# Patient Record
Sex: Male | Born: 1950 | Race: White | Hispanic: No | Smoking: Current every day smoker
Health system: Southern US, Community
[De-identification: ages and names within clinical notes are randomized; demographics above are authoritative.]

## PROBLEM LIST (undated history)

## (undated) DIAGNOSIS — I2510 Atherosclerotic heart disease of native coronary artery without angina pectoris: Secondary | ICD-10-CM

## (undated) DIAGNOSIS — I639 Cerebral infarction, unspecified: Secondary | ICD-10-CM

## (undated) DIAGNOSIS — I219 Acute myocardial infarction, unspecified: Secondary | ICD-10-CM

## (undated) DIAGNOSIS — F17200 Nicotine dependence, unspecified, uncomplicated: Secondary | ICD-10-CM

## (undated) DIAGNOSIS — Z955 Presence of coronary angioplasty implant and graft: Secondary | ICD-10-CM

## (undated) DIAGNOSIS — I509 Heart failure, unspecified: Secondary | ICD-10-CM

## (undated) DIAGNOSIS — R0600 Dyspnea, unspecified: Secondary | ICD-10-CM

## (undated) DIAGNOSIS — IMO0001 Reserved for inherently not codable concepts without codable children: Secondary | ICD-10-CM

## (undated) DIAGNOSIS — J449 Chronic obstructive pulmonary disease, unspecified: Secondary | ICD-10-CM

## (undated) HISTORY — DX: Cerebral infarction, unspecified: I63.9

## (undated) HISTORY — PX: APPENDECTOMY: SHX54

## (undated) HISTORY — DX: Heart failure, unspecified: I50.9

## (undated) HISTORY — DX: Reserved for inherently not codable concepts without codable children: IMO0001

## (undated) HISTORY — PX: OTHER SURGICAL HISTORY: SHX169

## (undated) HISTORY — DX: Nicotine dependence, unspecified, uncomplicated: F17.200

## (undated) HISTORY — DX: Acute myocardial infarction, unspecified: I21.9

## (undated) HISTORY — DX: Chronic obstructive pulmonary disease, unspecified: J44.9

---

## 2004-05-03 DIAGNOSIS — I219 Acute myocardial infarction, unspecified: Secondary | ICD-10-CM

## 2004-05-03 HISTORY — DX: Acute myocardial infarction, unspecified: I21.9

## 2006-02-06 ENCOUNTER — Emergency Department (HOSPITAL_COMMUNITY): Admission: EM | Admit: 2006-02-06 | Discharge: 2006-02-06 | Payer: Self-pay | Admitting: Family Medicine

## 2006-02-08 ENCOUNTER — Ambulatory Visit: Payer: Self-pay | Admitting: Nurse Practitioner

## 2006-02-10 ENCOUNTER — Ambulatory Visit: Payer: Self-pay | Admitting: Nurse Practitioner

## 2006-04-11 ENCOUNTER — Ambulatory Visit: Payer: Self-pay | Admitting: Nurse Practitioner

## 2006-05-09 ENCOUNTER — Ambulatory Visit: Payer: Self-pay | Admitting: Nurse Practitioner

## 2006-07-01 ENCOUNTER — Ambulatory Visit (HOSPITAL_COMMUNITY): Admission: RE | Admit: 2006-07-01 | Discharge: 2006-07-01 | Payer: Self-pay | Admitting: Cardiovascular Disease

## 2006-07-04 ENCOUNTER — Ambulatory Visit: Payer: Self-pay | Admitting: Nurse Practitioner

## 2006-08-15 ENCOUNTER — Ambulatory Visit: Payer: Self-pay | Admitting: Nurse Practitioner

## 2006-08-18 ENCOUNTER — Emergency Department (HOSPITAL_COMMUNITY): Admission: EM | Admit: 2006-08-18 | Discharge: 2006-08-18 | Payer: Self-pay | Admitting: Emergency Medicine

## 2006-10-17 ENCOUNTER — Ambulatory Visit: Payer: Self-pay | Admitting: Internal Medicine

## 2007-06-06 ENCOUNTER — Emergency Department (HOSPITAL_COMMUNITY): Admission: EM | Admit: 2007-06-06 | Discharge: 2007-06-06 | Payer: Self-pay | Admitting: Emergency Medicine

## 2007-06-12 ENCOUNTER — Ambulatory Visit: Payer: Self-pay | Admitting: Internal Medicine

## 2007-10-05 IMAGING — CT CT HEAD W/O CM
1 of 2 series · 16 of 30 positions shown, 20 images · IV contrast (agent unspecified)
Comparison: none

CLINICAL DATA: Fell off treadmill and hit head.
 HEAD CT WITHOUT CONTRAST:
TECHNIQUE: Contiguous axial images were obtained from the base of the skull through the vertex according to standard protocol without contrast.
 No comparison.

[Series 3: recon 2: trauma head · axial · 0.47mm/px · z∈[+133,+273]mm · 16 of 60 slices shown, 20 images]
[im 4/60  brain]
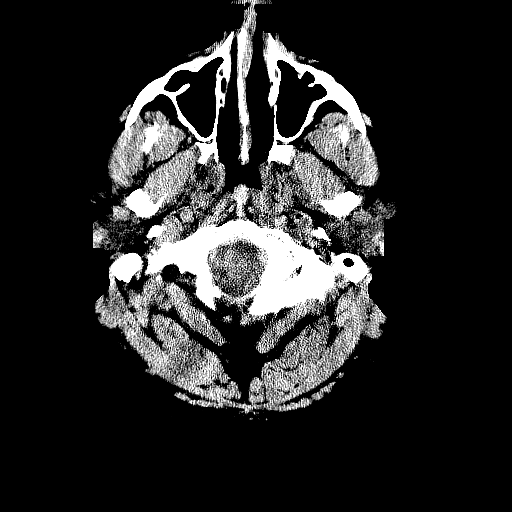
[im 4/60  bone]
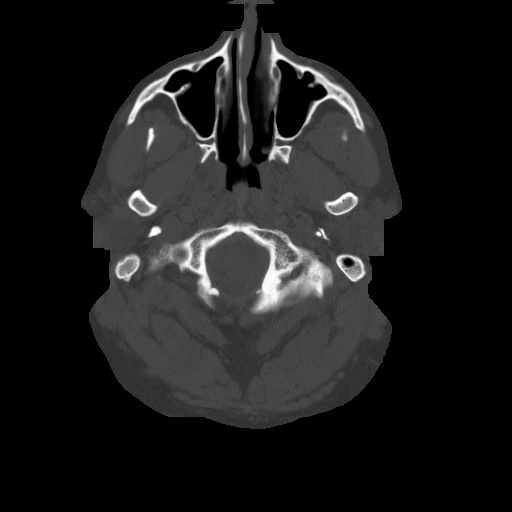
[im 7/60  brain]
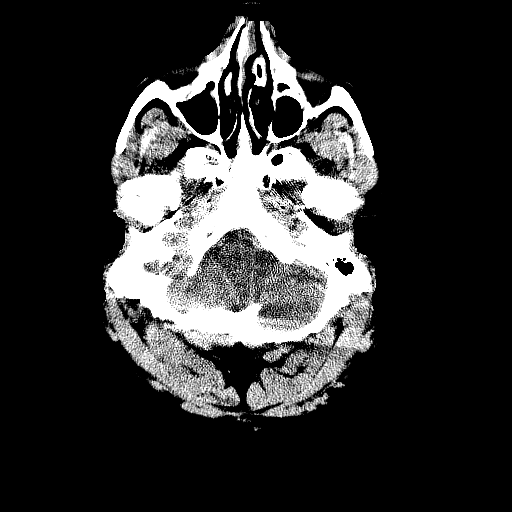
[im 10/60  brain]
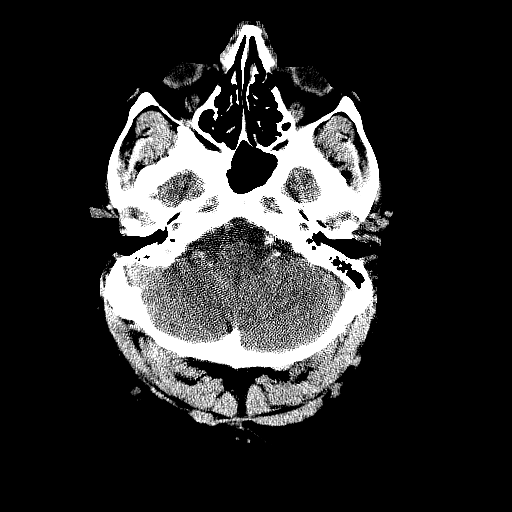
[im 13/60  brain]
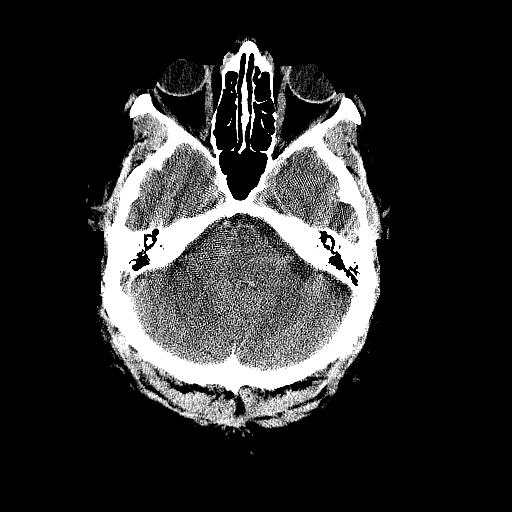
[im 19/60  brain]
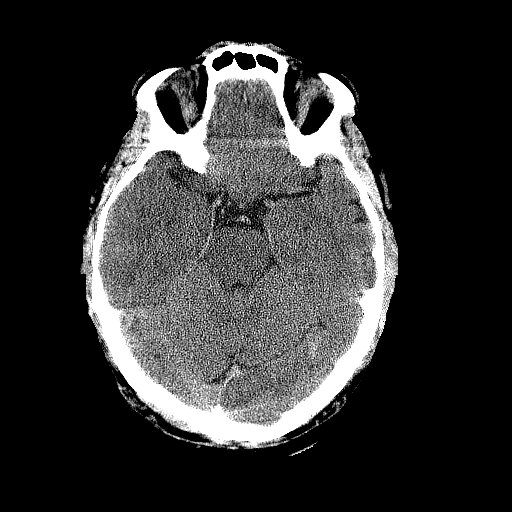
[im 19/60  bone]
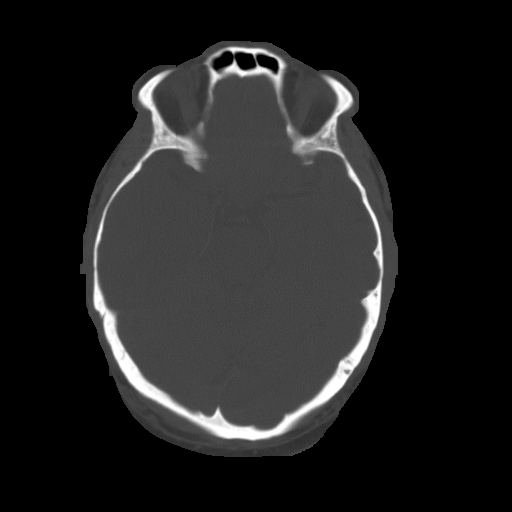
[im 22/60  brain]
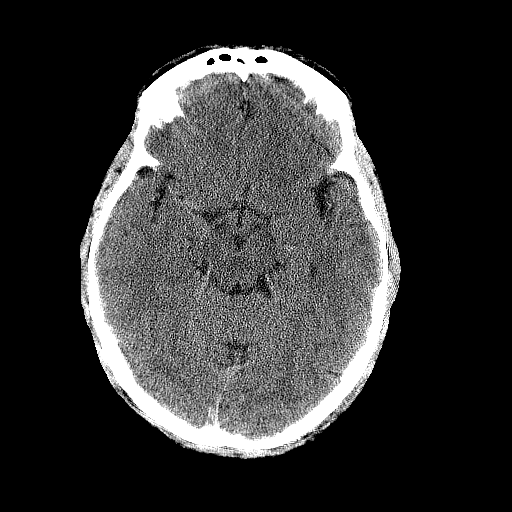
[im 25/60  brain]
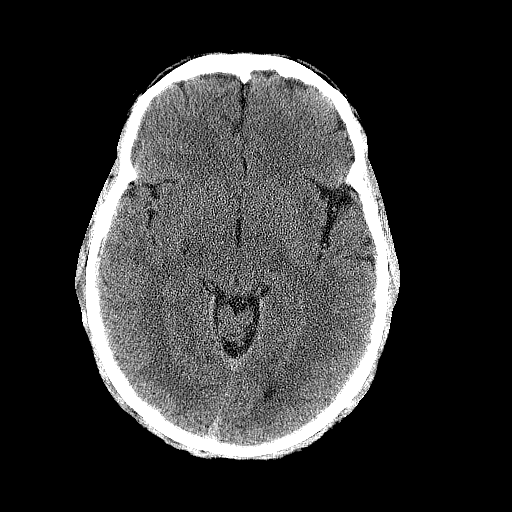
[im 28/60  brain]
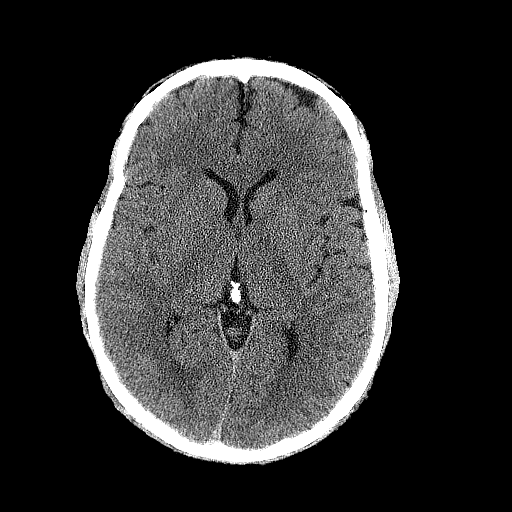
[im 32/60  brain]
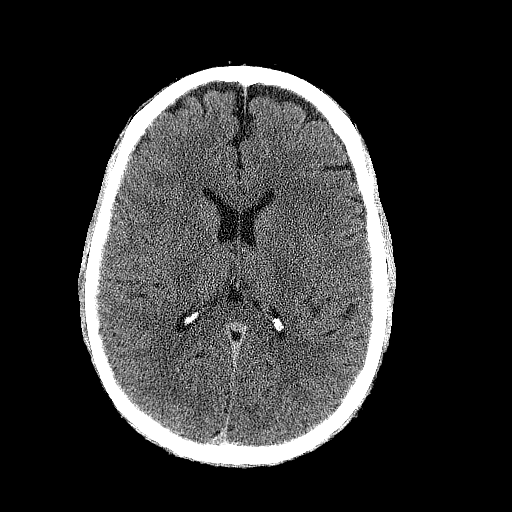
[im 32/60  bone]
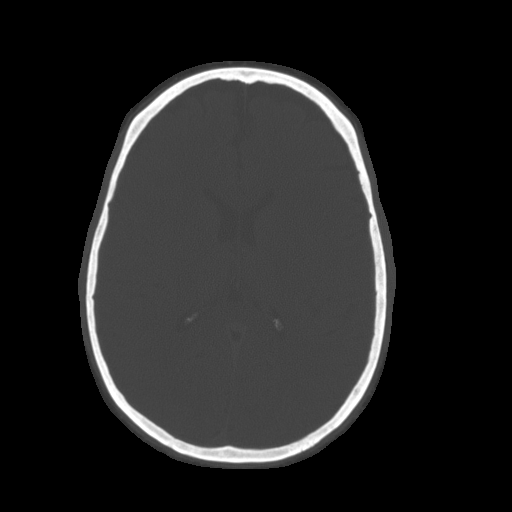
[im 35/60  brain]
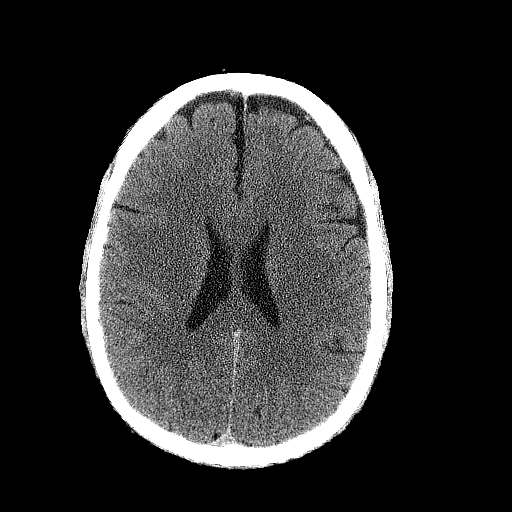
[im 38/60  brain]
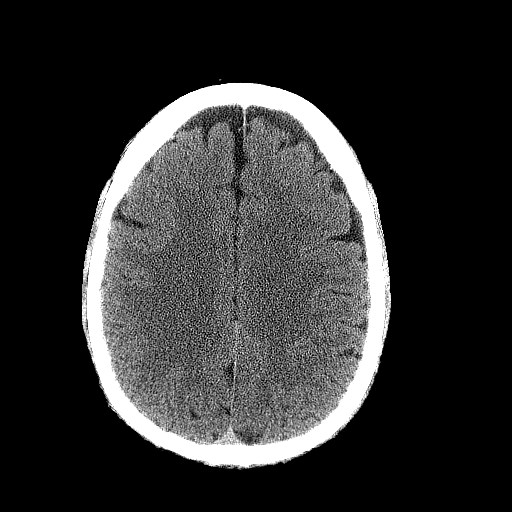
[im 41/60  brain]
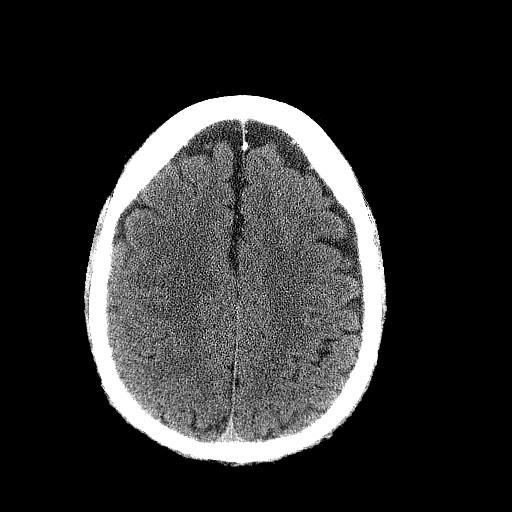
[im 47/60  brain]
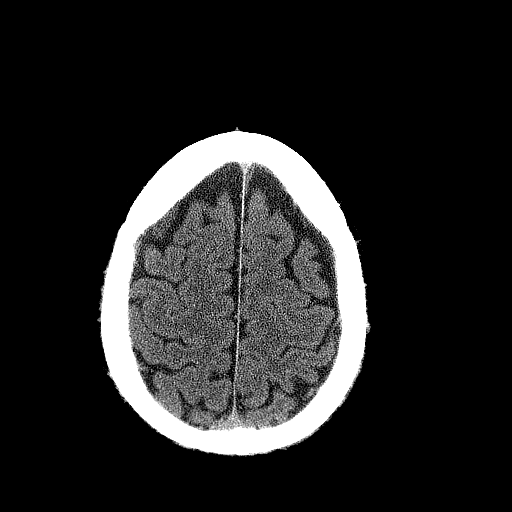
[im 47/60  bone]
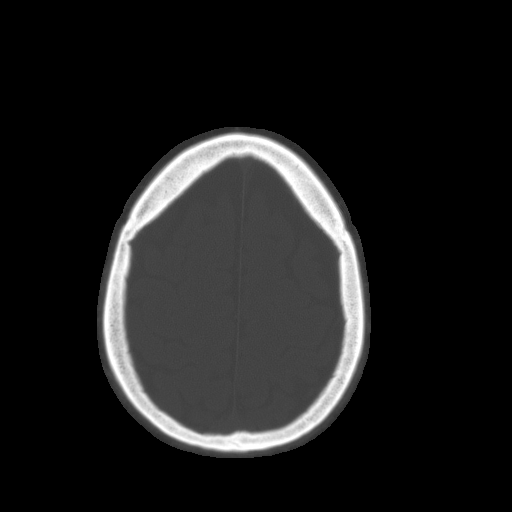
[im 50/60  brain]
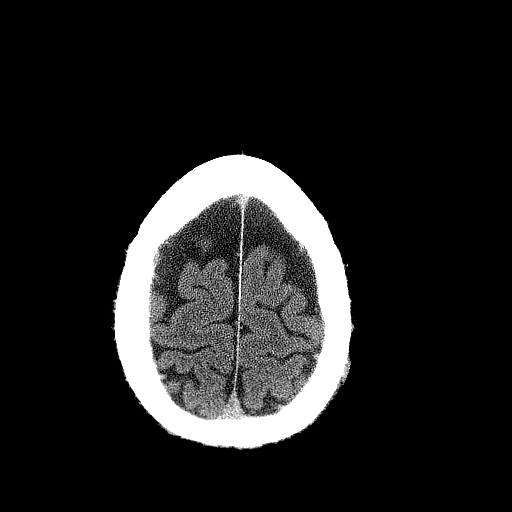
[im 53/60  brain]
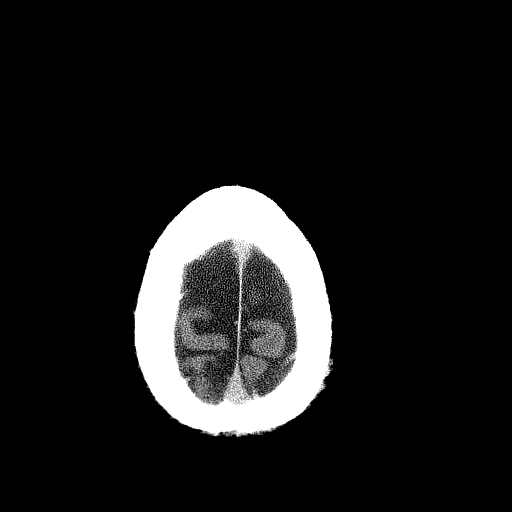
[im 56/60  brain]
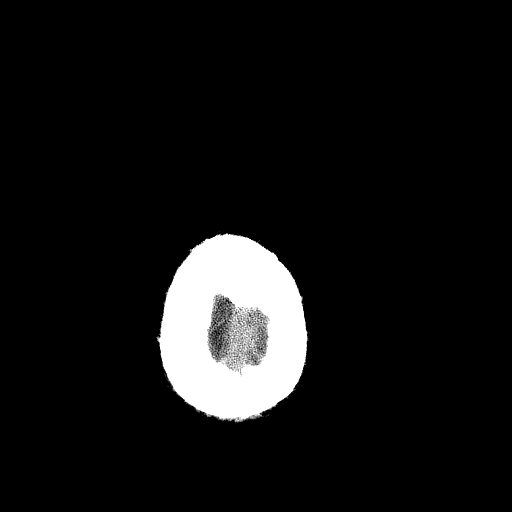

[16 of 30 positions shown; findings below may reference images not displayed]

FINDINGS: There is no evidence of intracranial hemorrhage, brain edema, acute infarct, mass lesion, or mass effect.  No other intra-axial abnormalities are seen, and the ventricles are within normal limits.  No abnormal extra-axial fluid collections or masses are identified.  No skull abnormalities are noted.
IMPRESSION: Negative non-contrast head CT.

## 2007-10-05 IMAGING — CR DG SHOULDER 2+V*L*
3 series · 3 of 3 positions shown · non-contrast
Comparison: none

CLINICAL DATA: Fall, left shoulder pain

LEFT SHOULDER - 3  VIEW:

[w shoulder ap internal left]
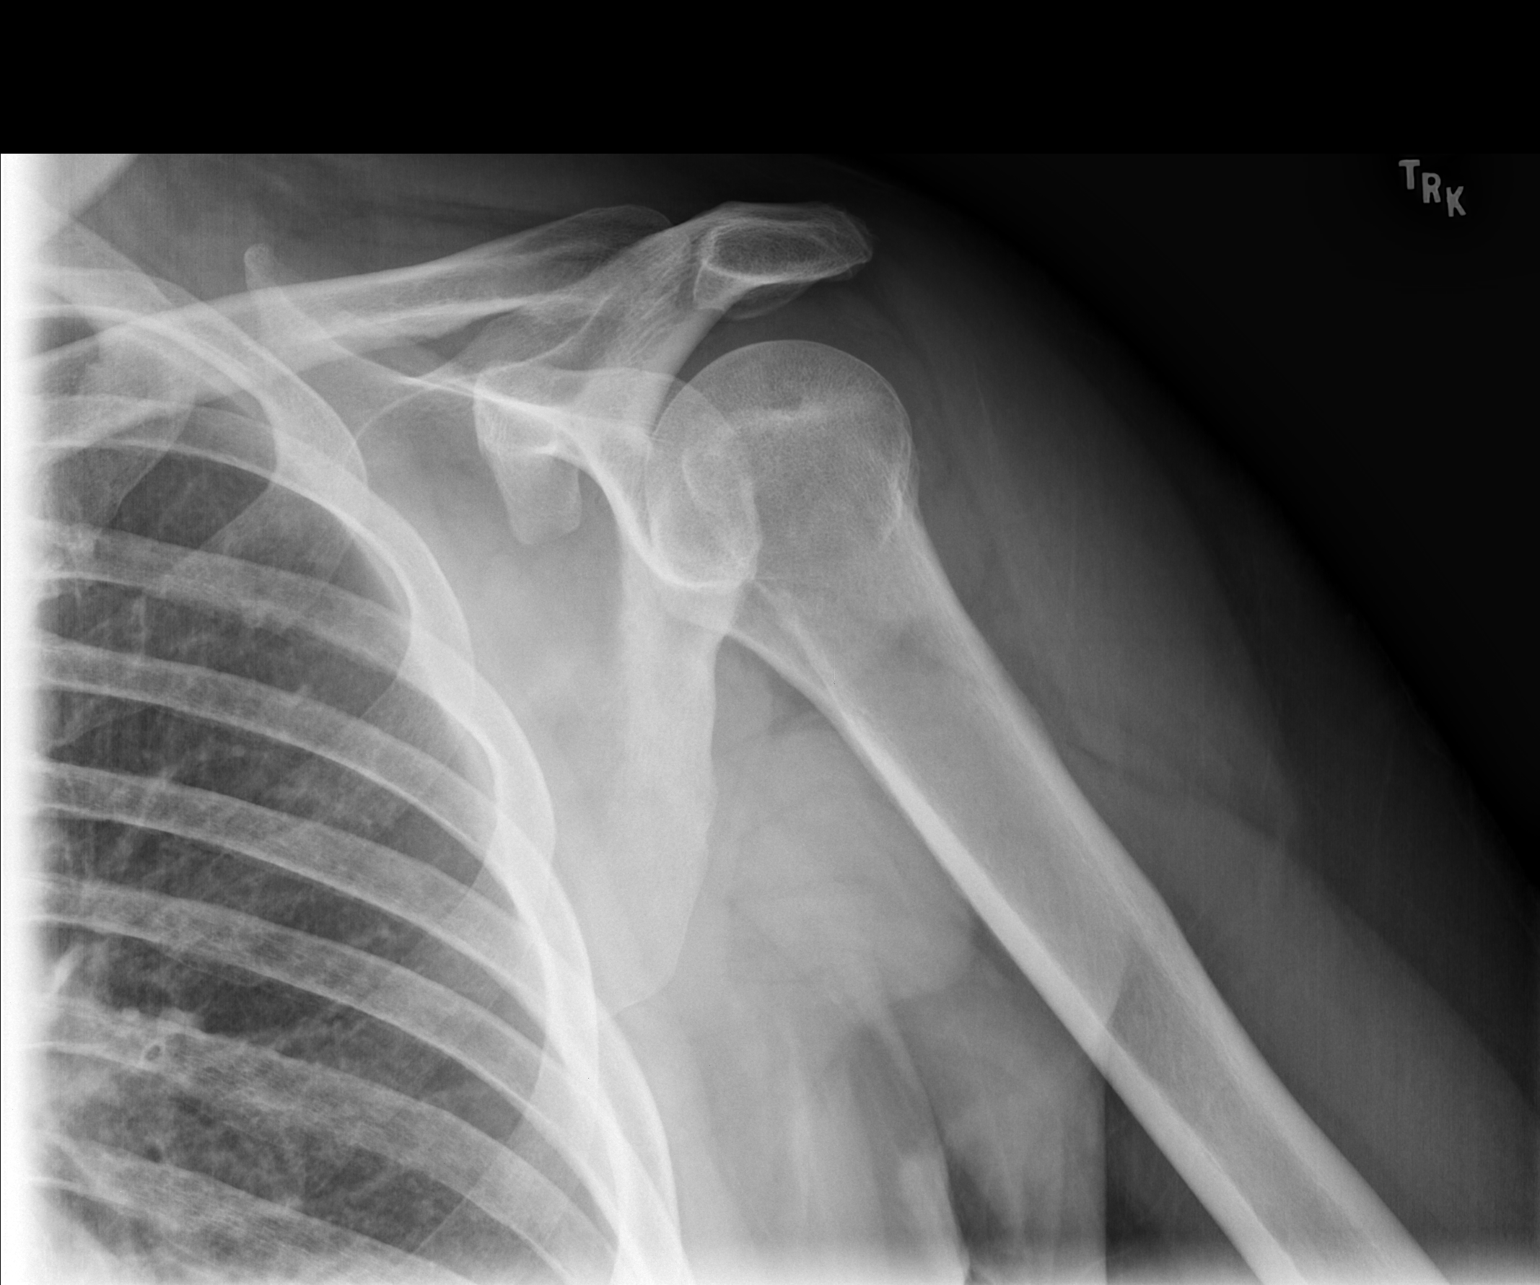

[w shoulder ap external left]
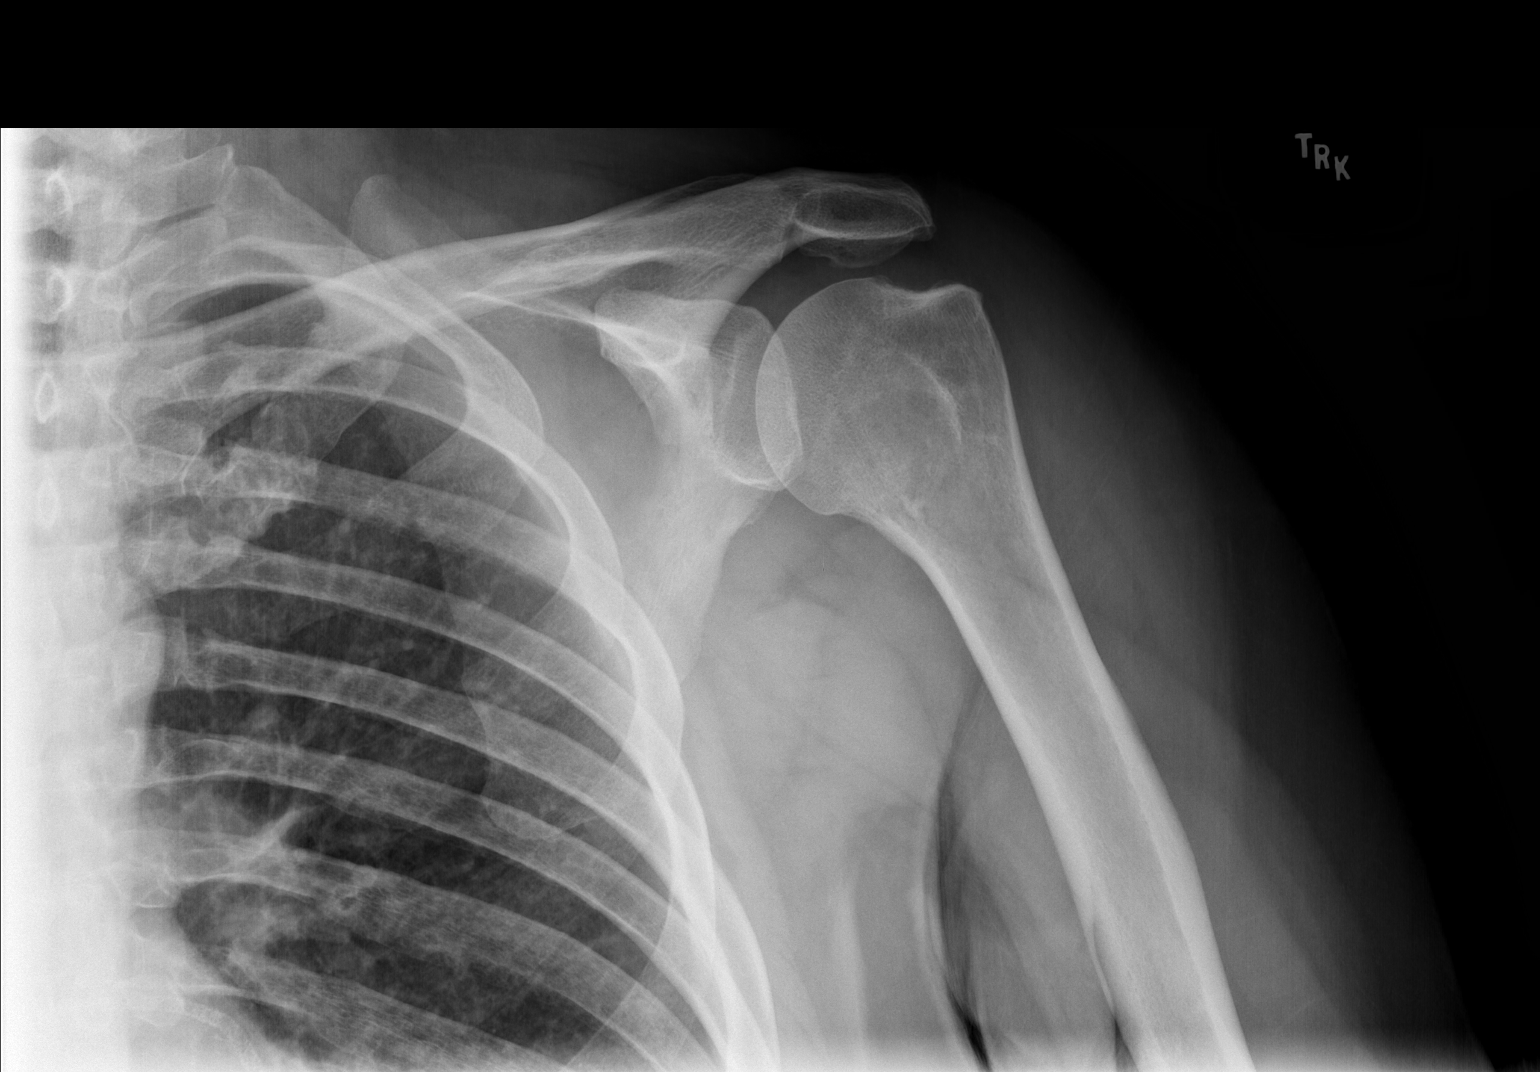

[x shoulder axillary left *]
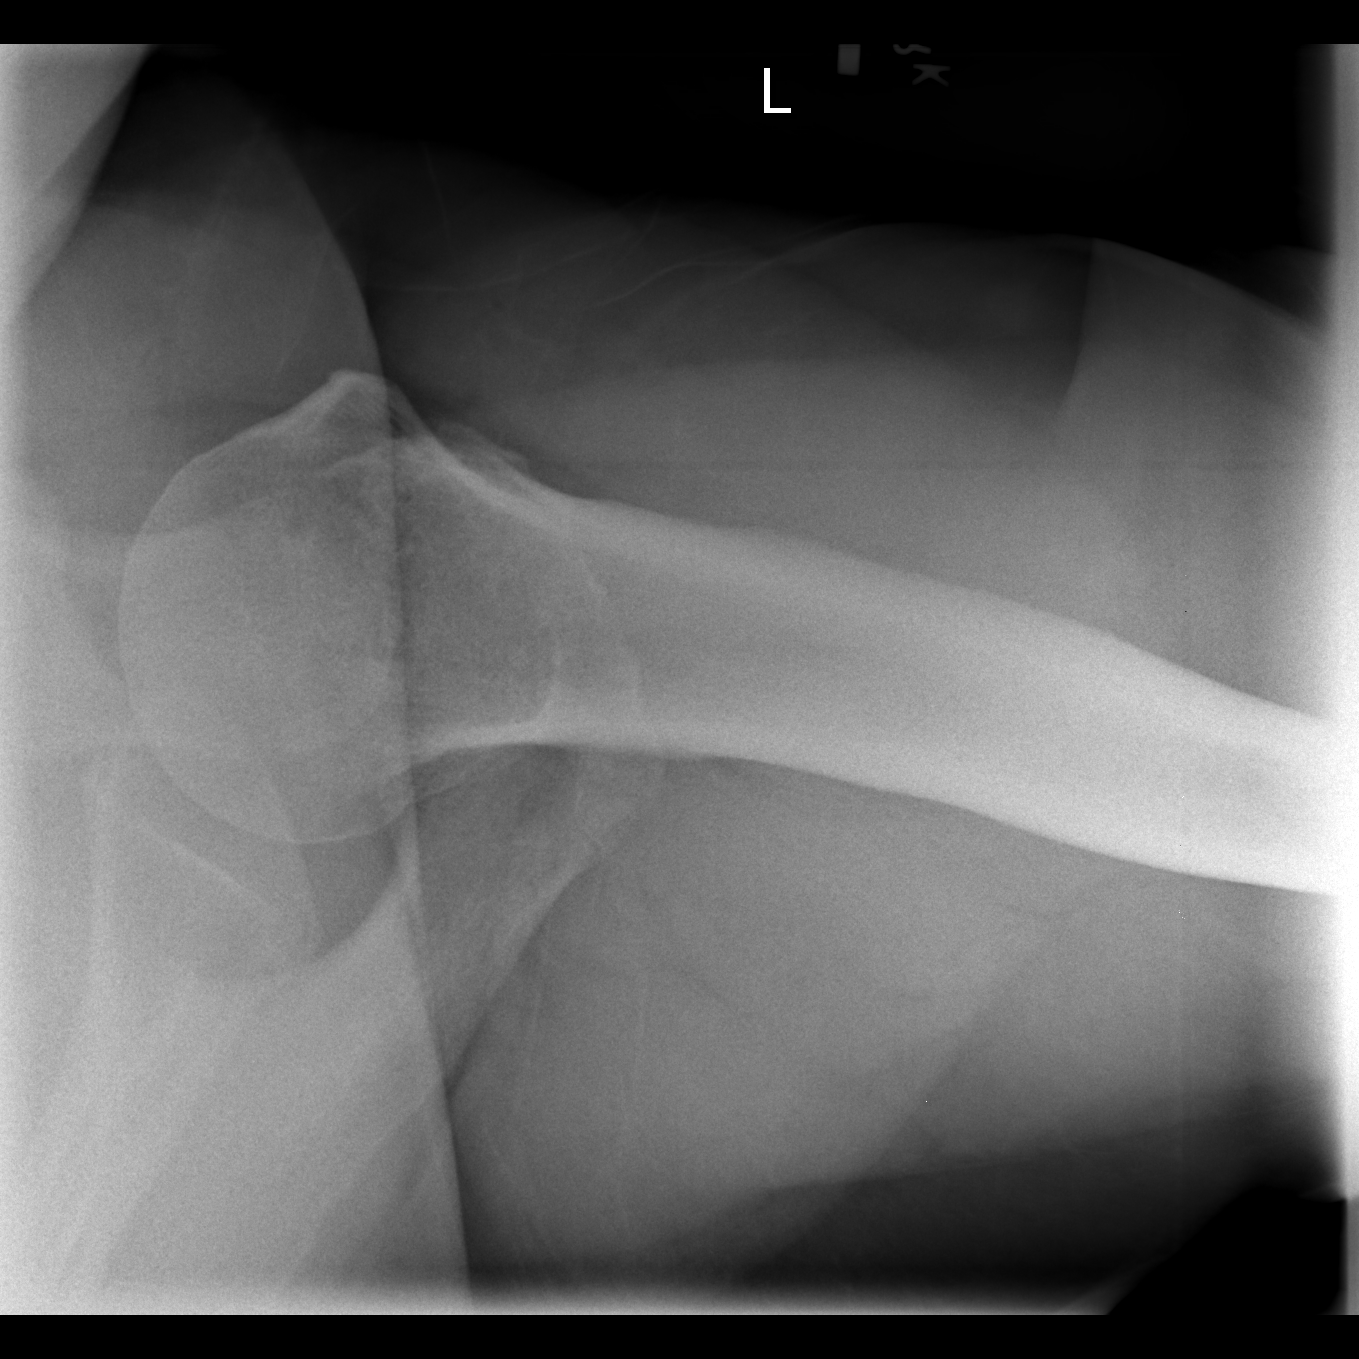

[3 of 3 positions shown; findings below may reference images not displayed]

FINDINGS: There is no evidence of fracture or dislocation.  There is no
evidence of arthropathy or other focal bone abnormality.  Soft tissues are
unremarkable.
IMPRESSION: Negative.

## 2007-10-05 IMAGING — CR DG CHEST 2V
2 series · 2 of 2 positions shown · non-contrast
Comparison: None.

Exam: Chest, 2 views.

HISTORY: Fell off treadmill and hit head. COPD.

[w chest pa]
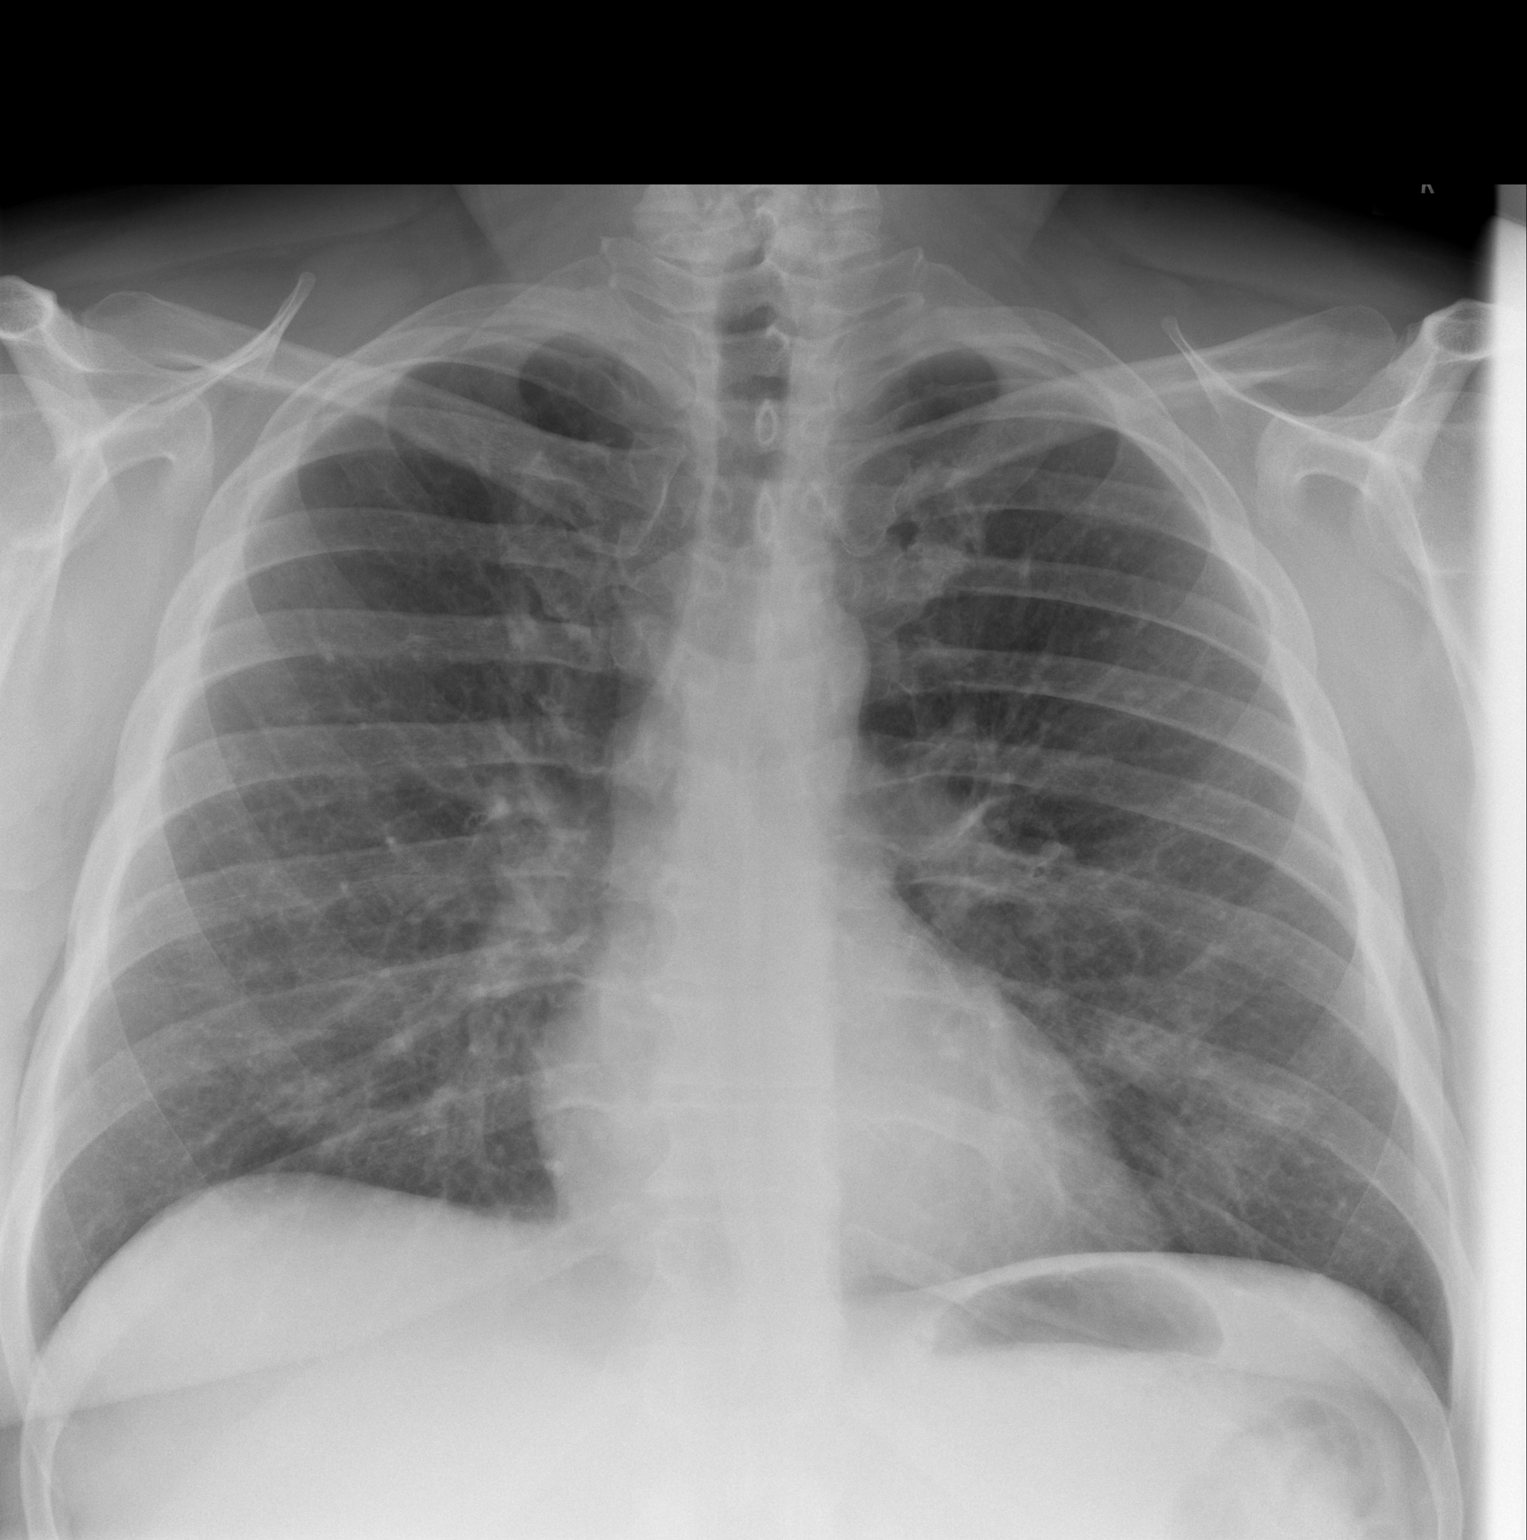

[w chest lat]
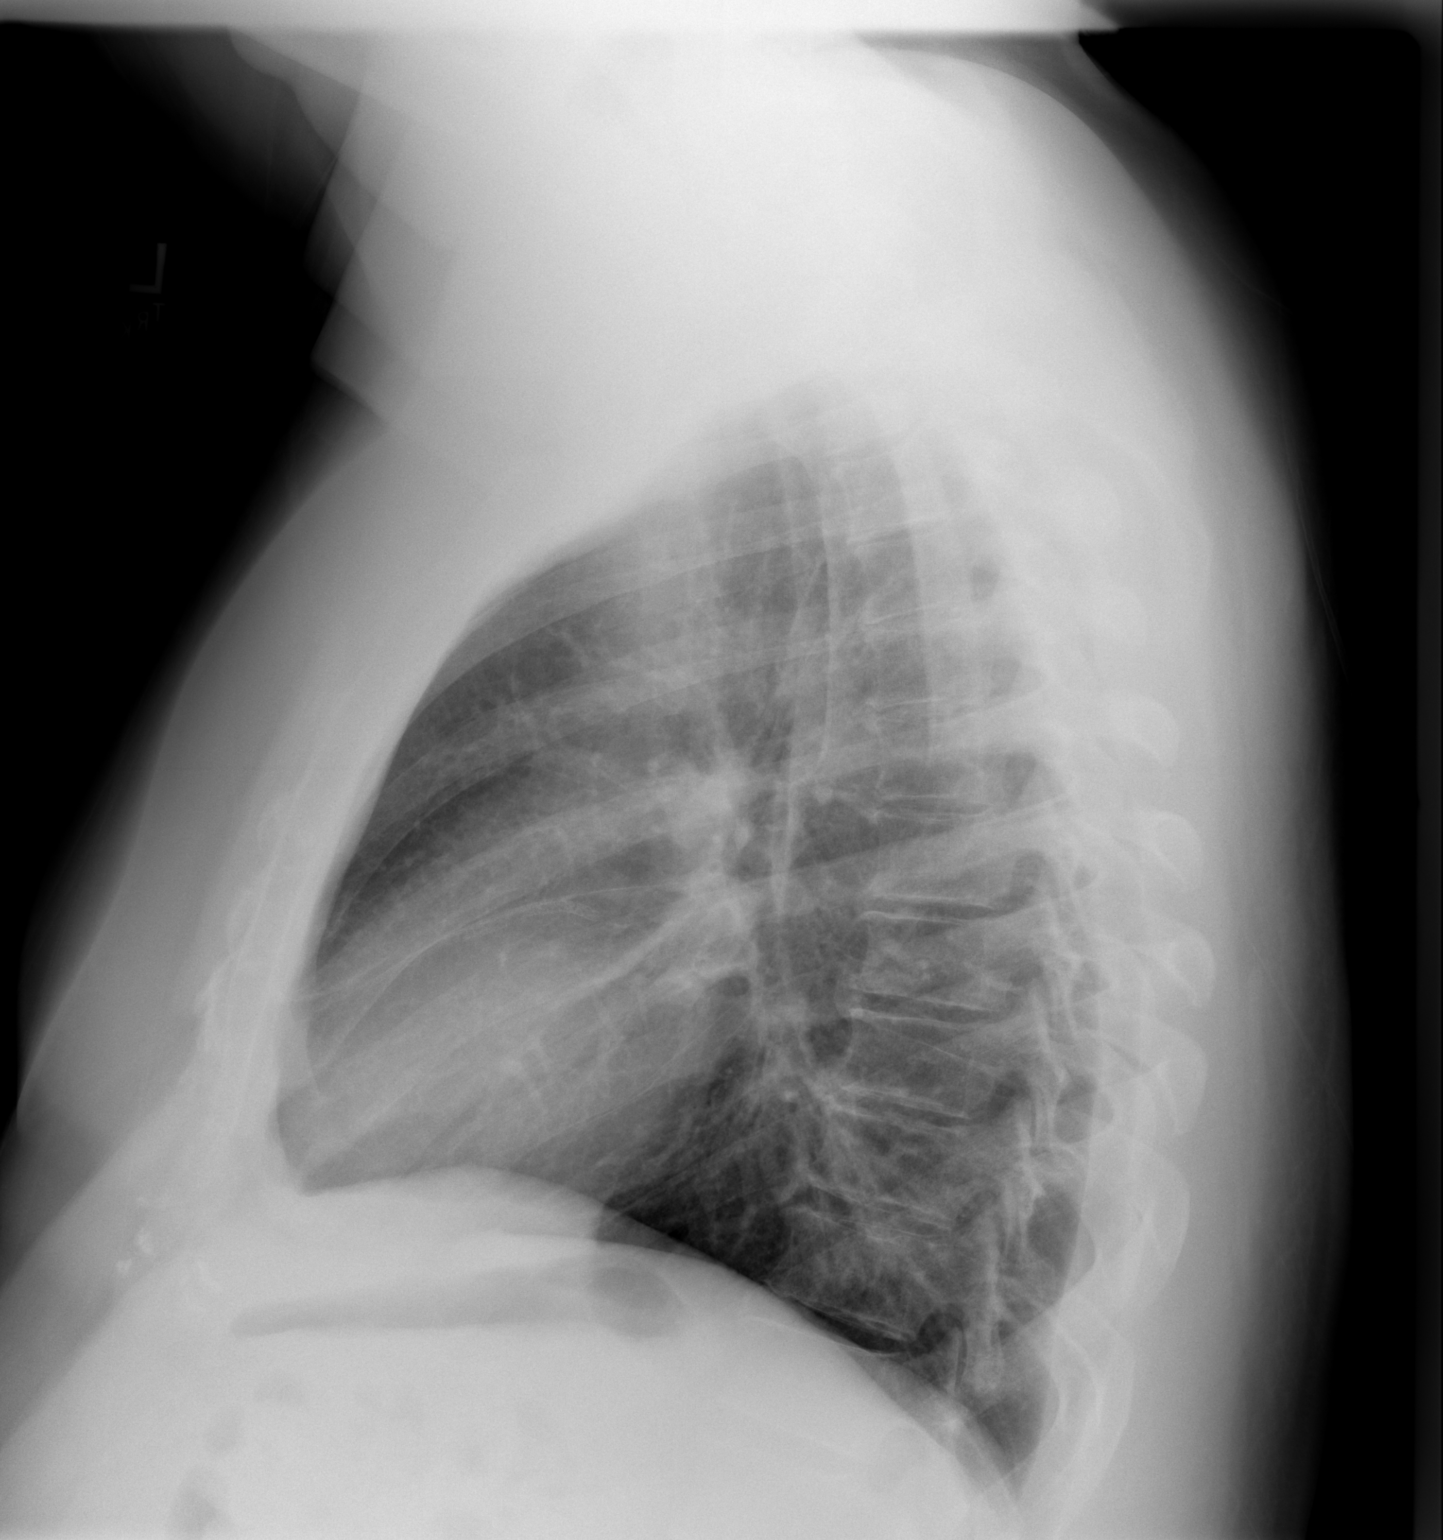

[2 of 2 positions shown; findings below may reference images not displayed]

FINDINGS: Heart size normal.

No effusions or edema.

No airspace opacities identified.

Interstitial change consistent with COPD noted.
IMPRESSION: 1. No active cardiopulmonary disease.

2. COPD/emphysema

## 2007-10-05 IMAGING — CR DG KNEE COMPLETE 4+V*L*
3 series · 3 of 3 positions shown · non-contrast
Comparison: none

CLINICAL DATA: Fall, left knee pain

LEFT KNEE - 4 VIEW

[t knee ap left]
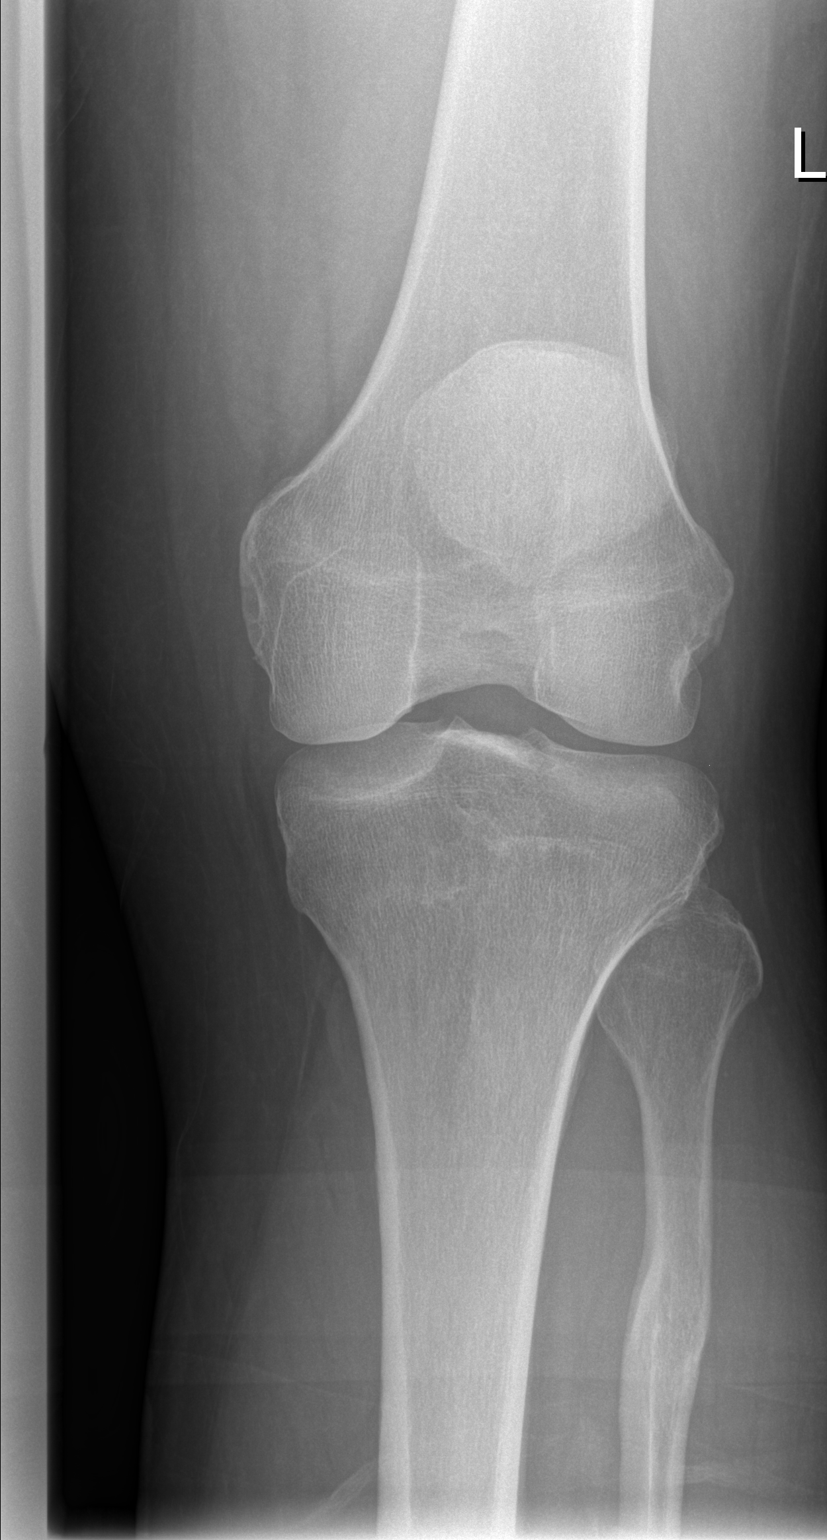

[t knee oblique left (1 of 2)]
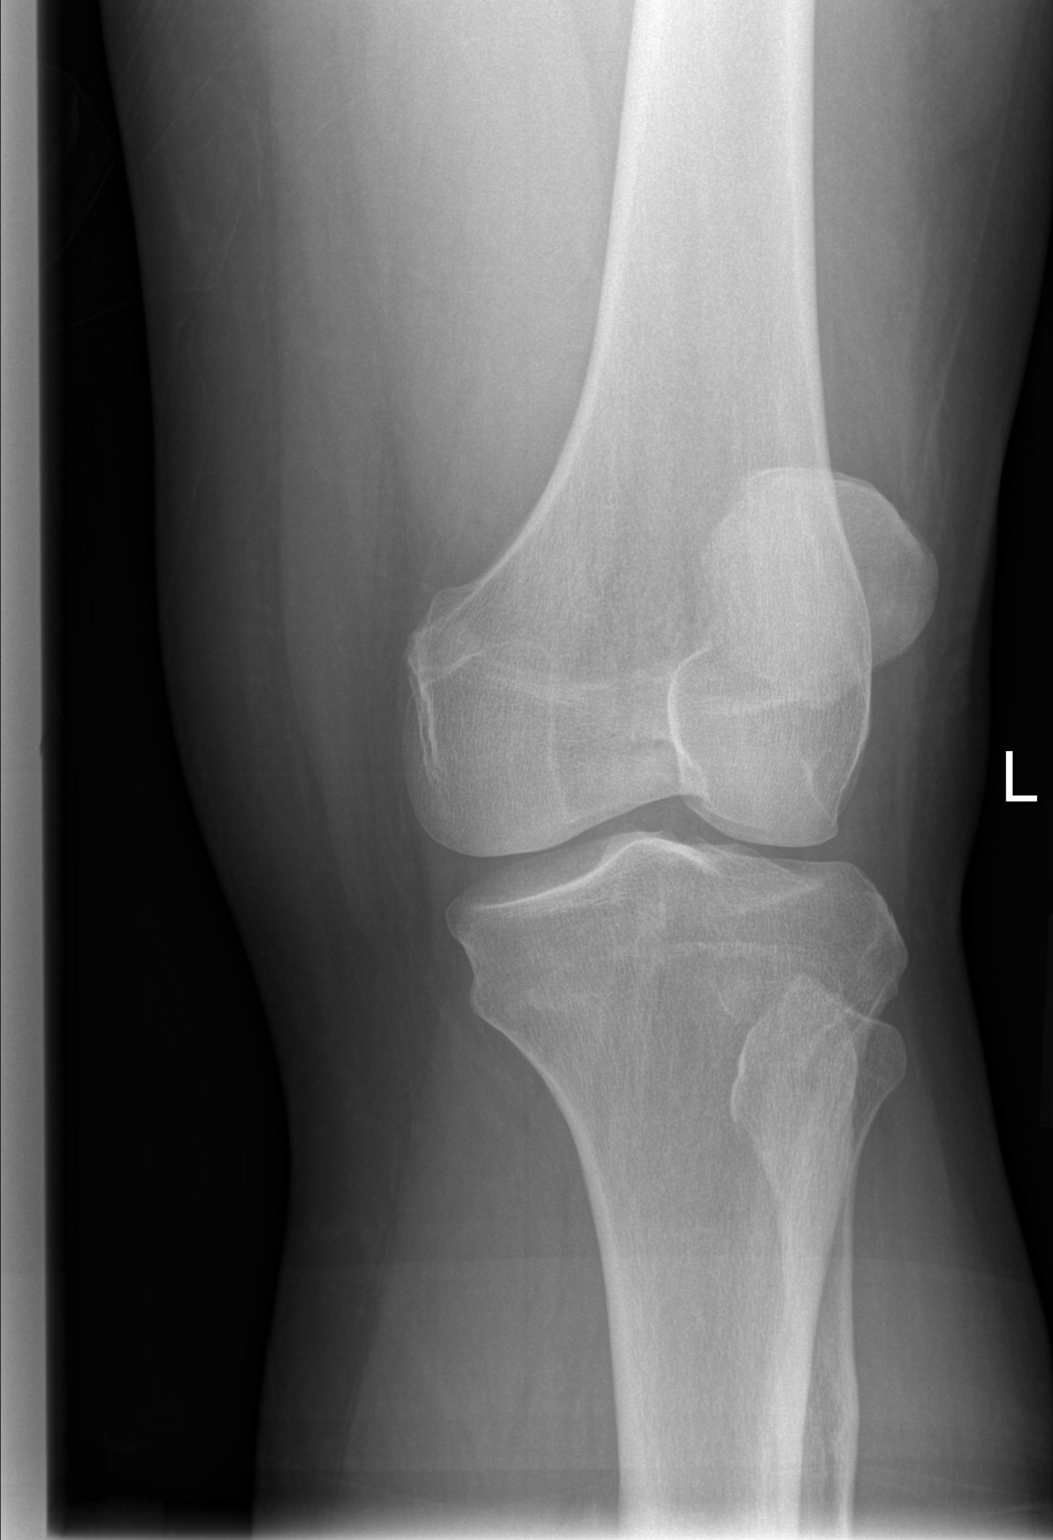

[t knee oblique left (2 of 2)]
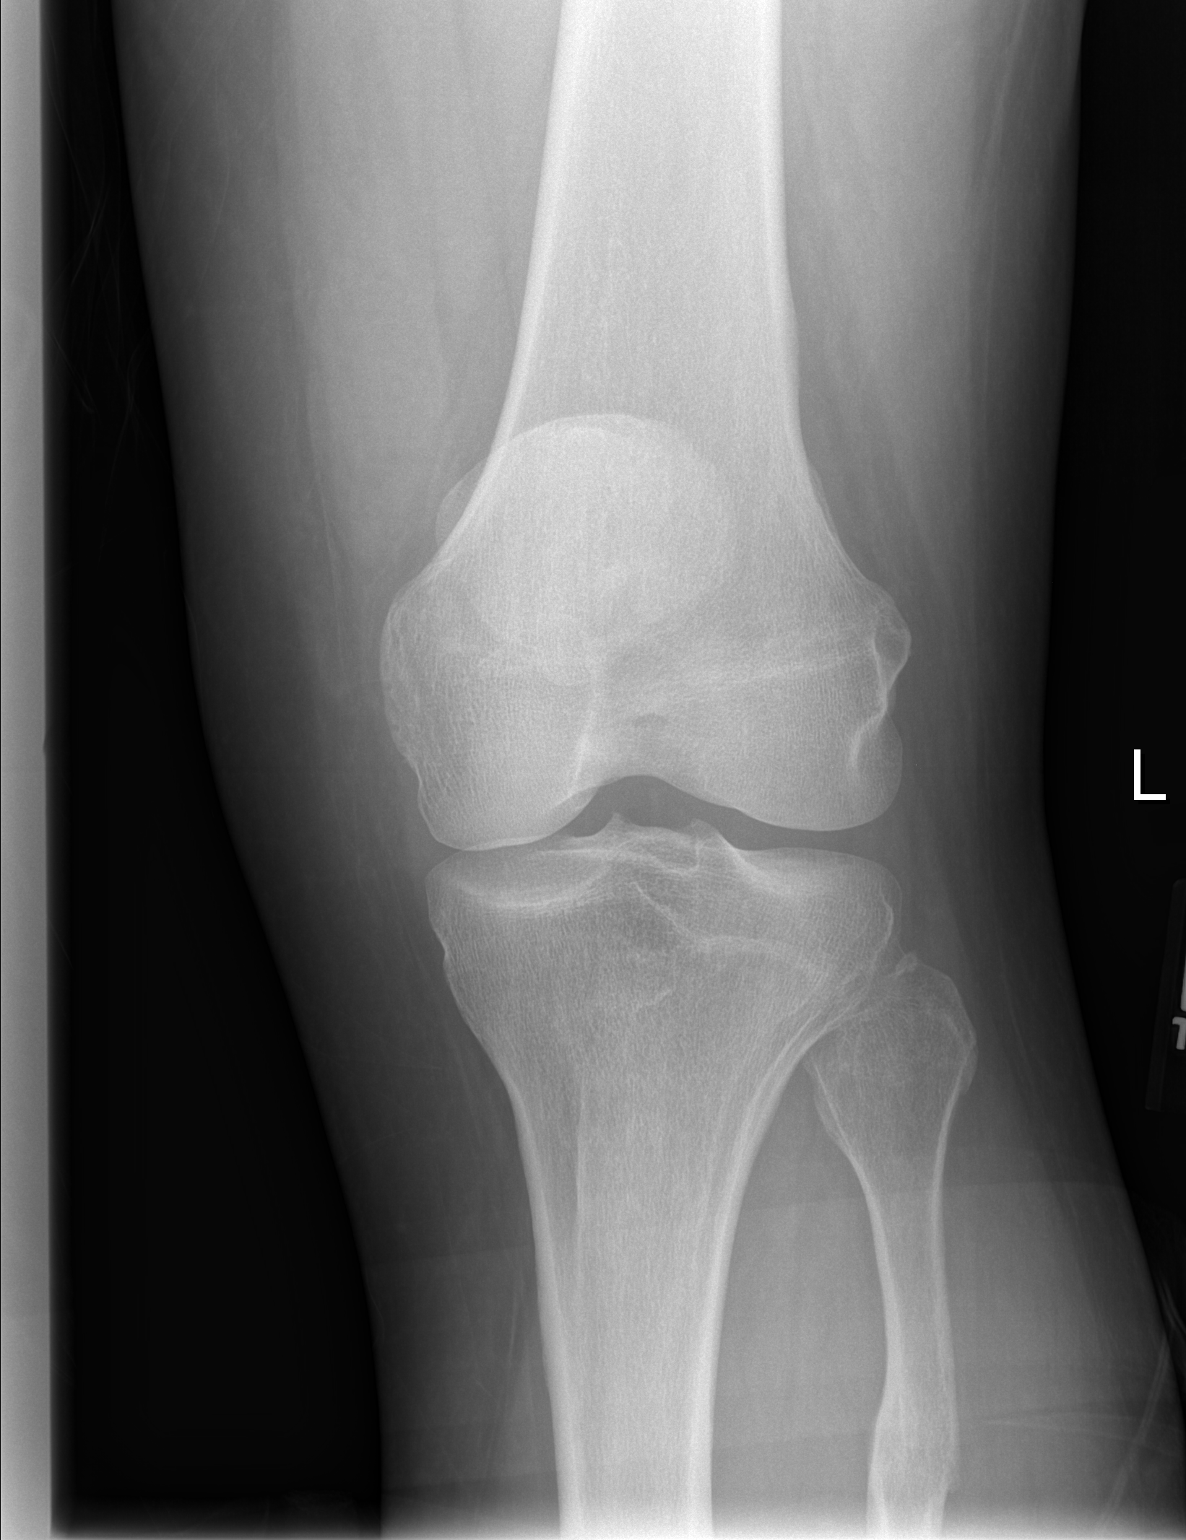

[3 of 3 positions shown; findings below may reference images not displayed]

FINDINGS: There is mild anterior soft tissue swelling. No evidence of acute
fracture, subluxation, dislocation, or joint effusion. Old healed proximal
fibular fracture noted.

IMPRESSION

No acute bony abnormality.

## 2009-02-17 ENCOUNTER — Ambulatory Visit: Payer: Self-pay | Admitting: Internal Medicine

## 2009-05-21 ENCOUNTER — Ambulatory Visit: Payer: Self-pay | Admitting: Internal Medicine

## 2009-09-08 ENCOUNTER — Inpatient Hospital Stay (HOSPITAL_COMMUNITY)
Admission: EM | Admit: 2009-09-08 | Discharge: 2009-09-09 | Payer: Self-pay | Source: Home / Self Care | Admitting: Emergency Medicine

## 2009-09-09 ENCOUNTER — Ambulatory Visit: Payer: Self-pay | Admitting: Surgery

## 2009-09-09 ENCOUNTER — Encounter (INDEPENDENT_AMBULATORY_CARE_PROVIDER_SITE_OTHER): Payer: Self-pay | Admitting: Cardiovascular Disease

## 2010-05-15 ENCOUNTER — Encounter (HOSPITAL_COMMUNITY)
Admission: RE | Admit: 2010-05-15 | Discharge: 2010-06-02 | Payer: Self-pay | Source: Home / Self Care | Attending: Cardiovascular Disease | Admitting: Cardiovascular Disease

## 2010-07-21 LAB — LIPID PANEL
Cholesterol: 158 mg/dL (ref 0–200)
HDL: 29 mg/dL — ABNORMAL LOW (ref >39)
LDL Cholesterol: 102 mg/dL — ABNORMAL HIGH (ref 0–99)
Total CHOL/HDL Ratio: 5.4 RATIO
Triglycerides: 135 mg/dL (ref <150)
VLDL: 27 mg/dL (ref 0–40)

## 2010-07-21 LAB — BASIC METABOLIC PANEL
BUN: 11 mg/dL (ref 6–23)
CO2: 29 mEq/L (ref 19–32)
Calcium: 9 mg/dL (ref 8.4–10.5)
Chloride: 100 mEq/L (ref 96–112)
Chloride: 104 mEq/L (ref 96–112)
Creatinine, Ser: 1.13 mg/dL (ref 0.4–1.5)
GFR calc Af Amer: >60 mL/min (ref >60)
Glucose, Bld: 97 mg/dL (ref 70–99)
Sodium: 139 mEq/L (ref 135–145)

## 2010-07-21 LAB — RAPID URINE DRUG SCREEN, HOSP PERFORMED
Barbiturates: NOT DETECTED
Benzodiazepines: NOT DETECTED
Cocaine: NOT DETECTED
Opiates: NOT DETECTED
Tetrahydrocannabinol: NOT DETECTED

## 2010-07-21 LAB — URINALYSIS, ROUTINE W REFLEX MICROSCOPIC
Glucose, UA: NEGATIVE mg/dL
Hgb urine dipstick: NEGATIVE
Nitrite: NEGATIVE
Protein, ur: NEGATIVE mg/dL
Urobilinogen, UA: 0.2 mg/dL (ref 0.0–1.0)
pH: 7 (ref 5.0–8.0)

## 2010-07-21 LAB — CBC
Hemoglobin: 15.5 g/dL (ref 13.0–17.0)
MCV: 96.1 fL (ref 78.0–100.0)
Platelets: 284 10*3/uL (ref 150–400)
RBC: 4.99 MIL/uL (ref 4.22–5.81)
RDW: 13.8 % (ref 11.5–15.5)

## 2010-07-21 LAB — POCT CARDIAC MARKERS
CKMB, poc: 1.2 ng/mL (ref 1.0–8.0)
Troponin i, poc: <0.05 ng/mL (ref 0.00–0.09)

## 2010-07-21 LAB — DIFFERENTIAL
Basophils Absolute: 0.3 10*3/uL — ABNORMAL HIGH (ref 0.0–0.1)
Basophils Relative: 1 % (ref 0–1)
Eosinophils Absolute: 0.5 10*3/uL (ref 0.0–0.7)
Lymphs Abs: 4.5 10*3/uL — ABNORMAL HIGH (ref 0.7–4.0)
Monocytes Absolute: 1 10*3/uL (ref 0.1–1.0)
Monocytes Relative: 8 % (ref 3–12)
Neutro Abs: 6.6 10*3/uL (ref 1.7–7.7)

## 2010-07-21 LAB — CK TOTAL AND CKMB (NOT AT ARMC)
CK, MB: 2 ng/mL (ref 0.3–4.0)
Relative Index: 1.3 (ref 0.0–2.5)
Total CK: 142 U/L (ref 7–232)

## 2010-07-21 LAB — PROTIME-INR
INR: 0.97 (ref 0.00–1.49)
Prothrombin Time: 12.8 seconds (ref 11.6–15.2)

## 2010-10-26 IMAGING — MR MR HEAD W/O CM
8 of 9 series · 26 of 48 positions shown · non-contrast
Comparison: Head CT [DATE]

MRI HEAD

CLINICAL DATA: Right hand and finger numbness.  Gait disturbance.

MRI HEAD WITHOUT CONTRAST
MRA HEAD WITHOUT CONTRAST
TECHNIQUE: Multiplanar, multiecho pulse sequences of the brain and
surrounding structures were obtained according to standard protocol
without intravenous contrast.  Angiographic images of the head were
obtained using MRA technique without contrast.

[Series 3: T1 · sagittal · 5.0mm · 0.47mm/px · 1 of 12 slices shown]
[im 1/12]
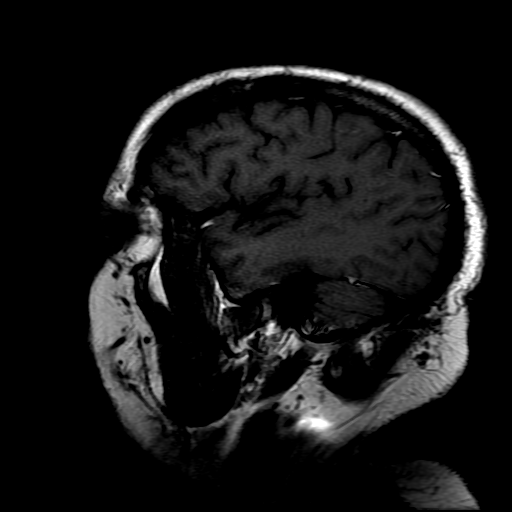

[Series 4: DWI · axial · 5.0mm · 1.09mm/px · z∈[-17,+126]mm · 7 of 54 slices shown (1 of 2)]
[im 1/54]
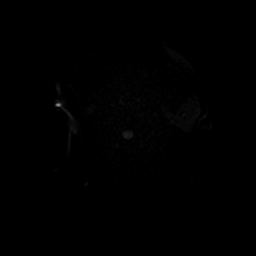
[im 9/54]
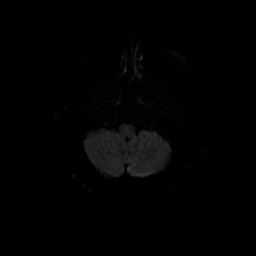
[im 18/54]
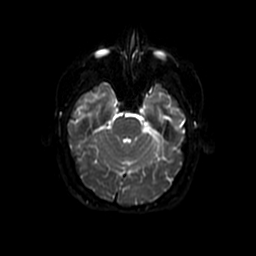
[im 27/54]
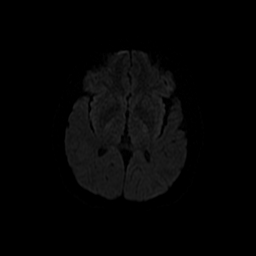
[im 36/54]
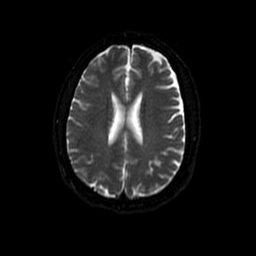
[im 45/54]
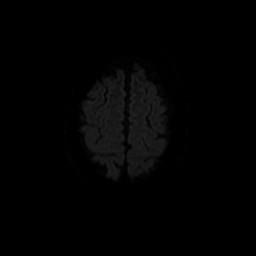
[im 54/54]
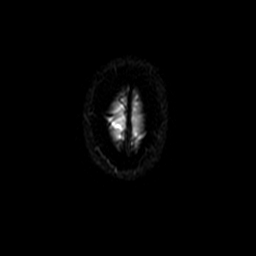

[Series 5: T2 · axial · 5.0mm · 0.43mm/px · z∈[-26,+120]mm · 3 of 22 slices shown (1 of 2)]
[im 1/22]
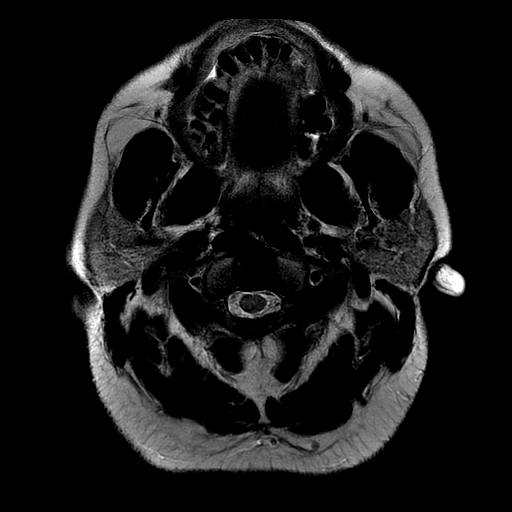
[im 11/22]
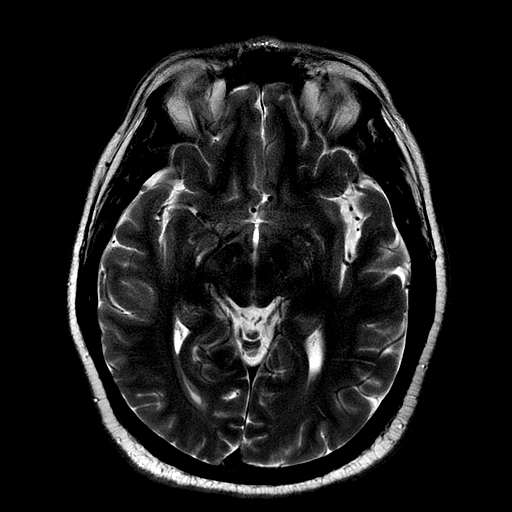
[im 22/22]
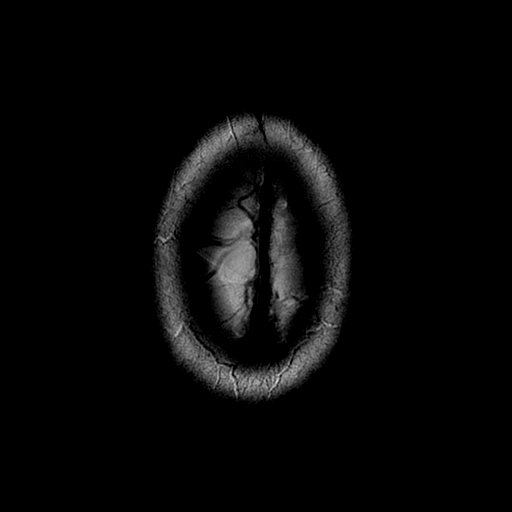

[Series 6: FLAIR · axial · 5.0mm · 0.43mm/px · z∈[-26,+120]mm · 3 of 22 slices shown]
[im 1/22]
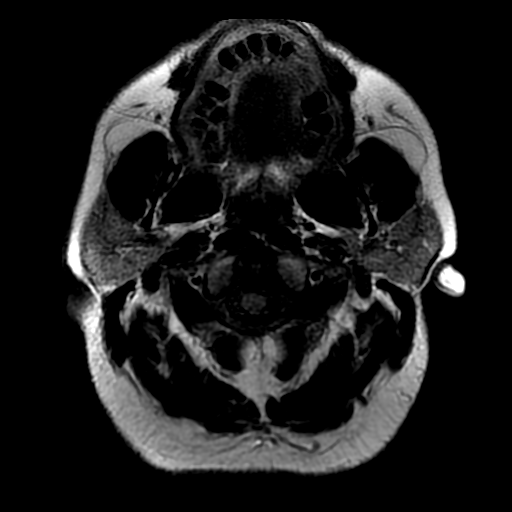
[im 11/22]
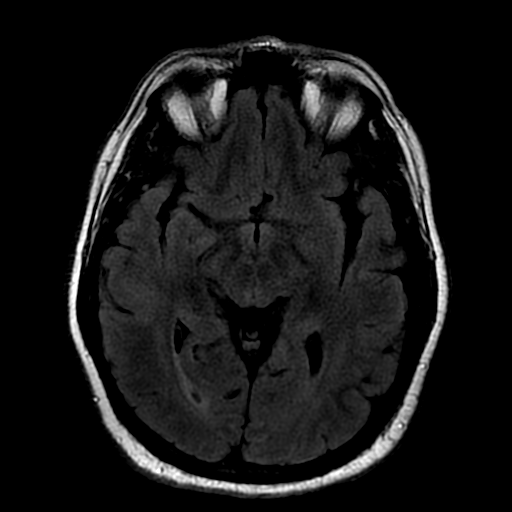
[im 22/22]
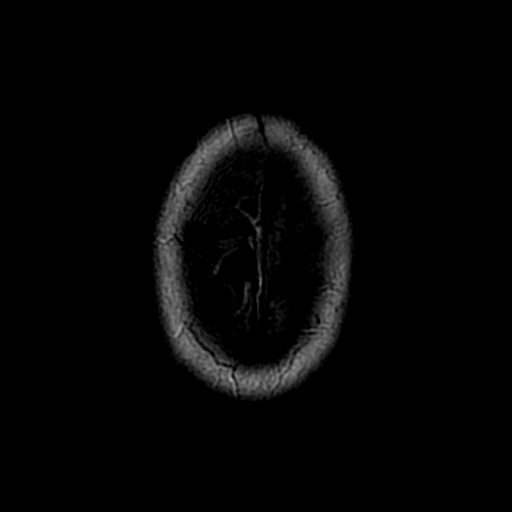

[Series 7: T2-star · axial · 5.0mm · 0.43mm/px · z∈[-26,+120]mm · 3 of 22 slices shown]
[im 1/22]
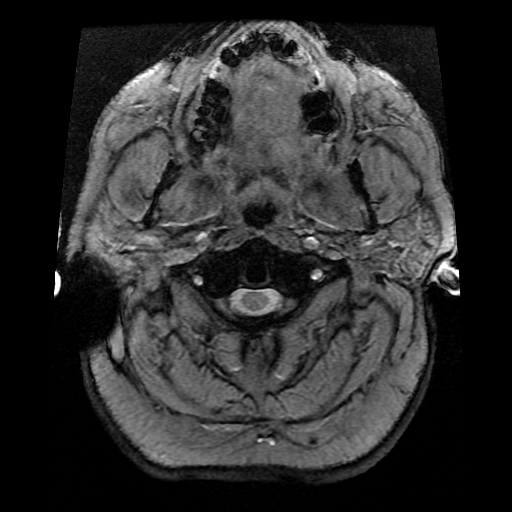
[im 11/22]
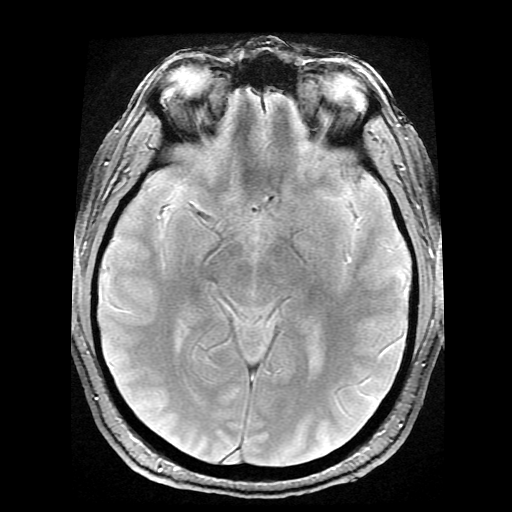
[im 22/22]
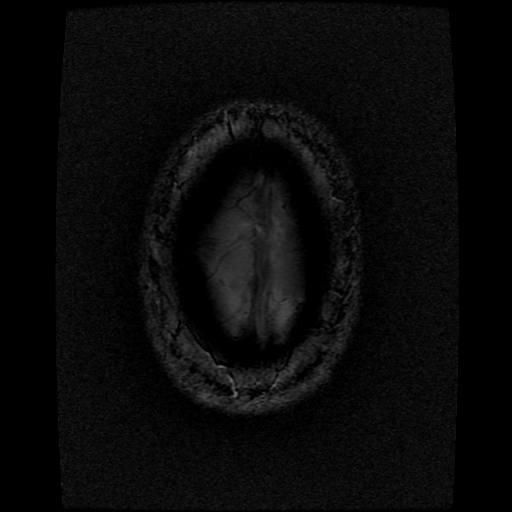

[Series 8: (id) mt fs · axial · 1.4mm · 0.43mm/px · z∈[-23,-11]mm · 2 of 136 slices shown]
[im 9/136]
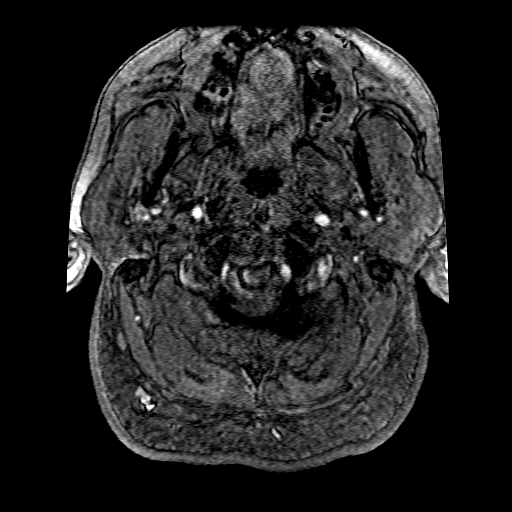
[im 26/136]
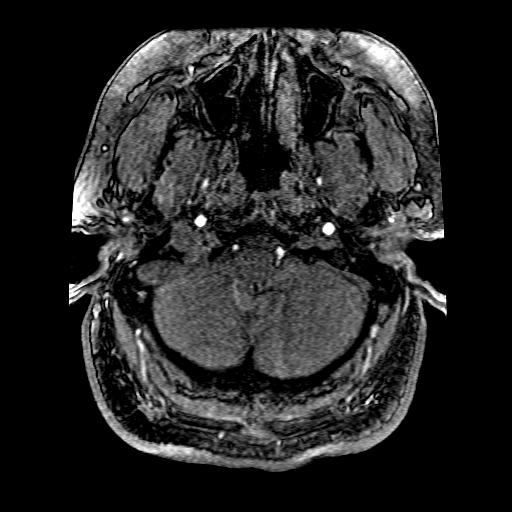

[Series 10: T2 · coronal · 5.0mm · 0.39mm/px · 4 of 28 slices shown (2 of 2)]
[im 1/28]
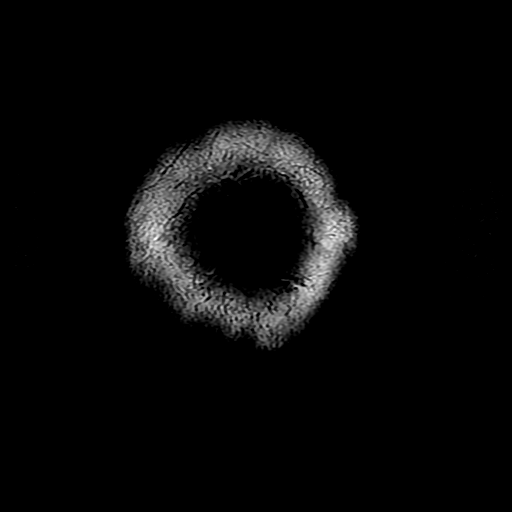
[im 10/28]
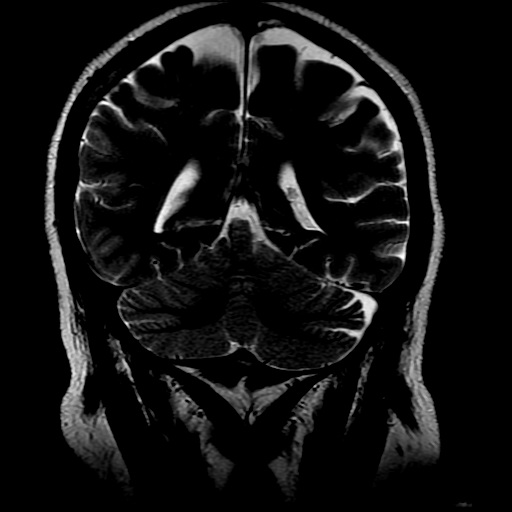
[im 19/28]
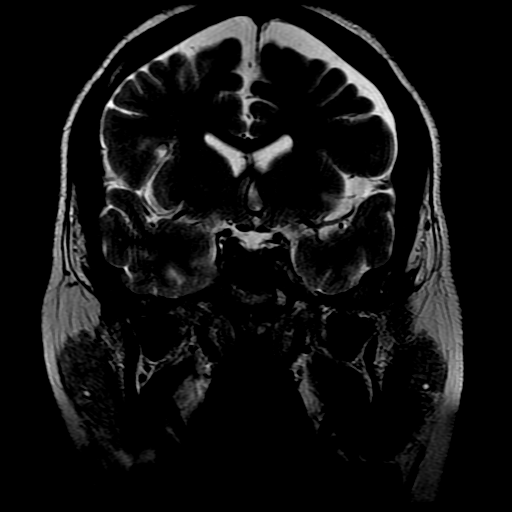
[im 28/28]
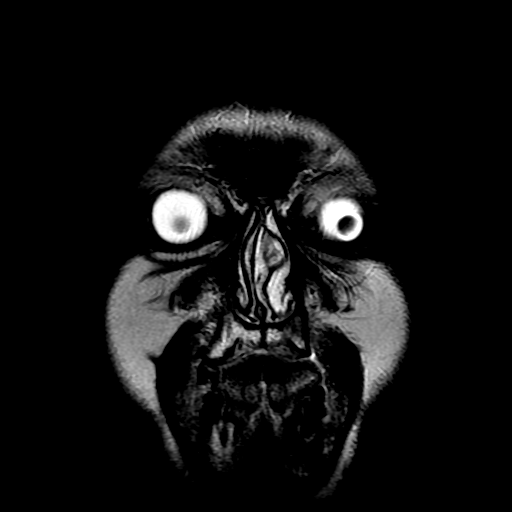

[Series 400: DWI · axial · 5.0mm · 1.09mm/px · z∈[-17,+126]mm · 3 of 27 slices shown (2 of 2)]
[im 1/27]
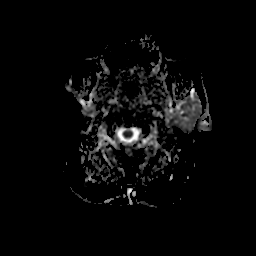
[im 14/27]
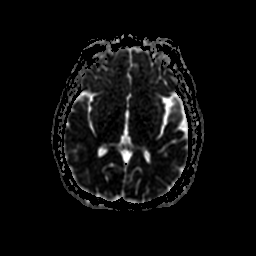
[im 27/27]
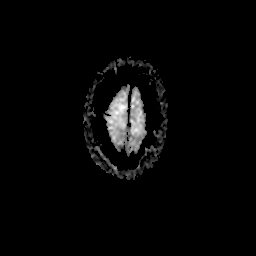

[26 of 48 positions shown; findings below may reference images not displayed]

FINDINGS: Diffusion imaging does not show any acute or subacute
infarction.  The brainstem and cerebellum are normal.  The cerebral
hemispheres are normal without evidence of atrophy, old or acute
small or large vessel infarction, mass lesion, hemorrhage,
hydrocephalus or extra-axial collection.  No pituitary mass.
Sinuses, middle ears and mastoids are clear.
IMPRESSION: Normal MRI of the brain

MRA HEAD
FINDINGS: Both vertebral arteries are patent to the basilar with
the left vertebral being dominant.  No basilar stenosis.  Posterior
circulation branch vessels are patent.

Both internal carotid arteries are patent into the brain.  The
right internal carotid artery primarily supplies the right middle
cerebral artery territory.  The A1 segment on the right is
hypoplastic.  The left internal carotid artery supplies the left
middle cerebral artery territory as well as giving most of the
supply to both anterior cerebral arteries.  No stenosis, aneurysm
or vascular malformation.
IMPRESSION: Normal intracranial MR angiography of the large and medium-sized
vessels.

## 2010-10-26 IMAGING — CT CT HEAD W/O CM
1 of 2 series · 13 of 30 positions shown, 17 images · non-contrast
Comparison: 08/18/2006.

CLINICAL DATA: Right arm pain and dizziness.  Blurred vision.
History multiple CVA.

CT HEAD WITHOUT CONTRAST
TECHNIQUE: Contiguous axial images were obtained from the base of
the skull through the vertex without contrast.

[Series 2: brain · axial · 0.47mm/px · z∈[+159,+291]mm · 13 of 32 slices shown, 17 images]
[im 3/32  brain]
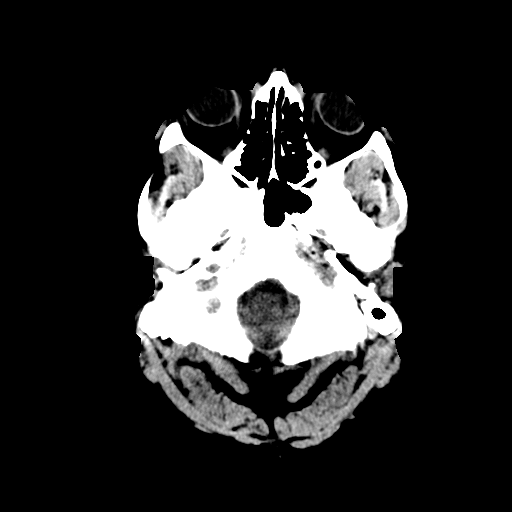
[im 3/32  bone]
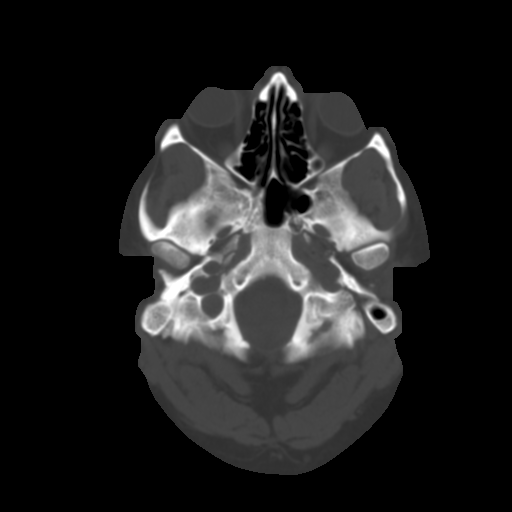
[im 5/32  brain]
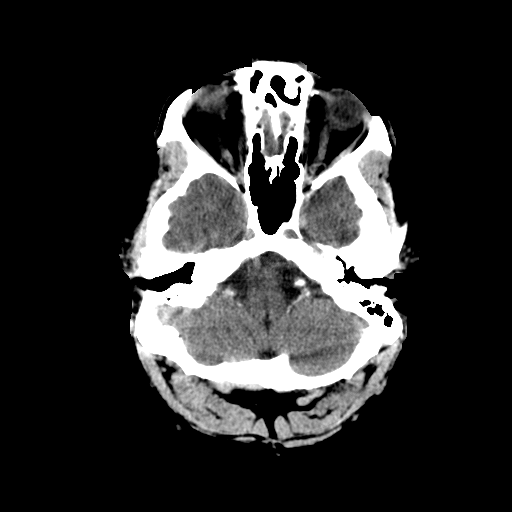
[im 7/32  brain]
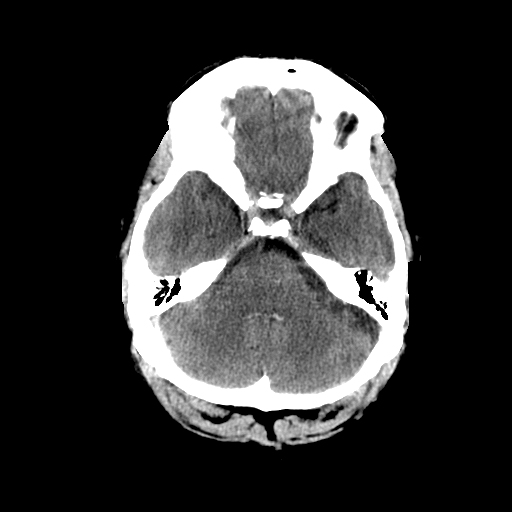
[im 9/32  brain]
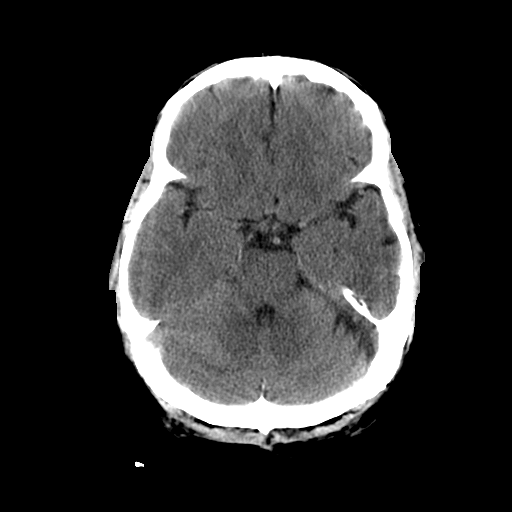
[im 12/32  brain]
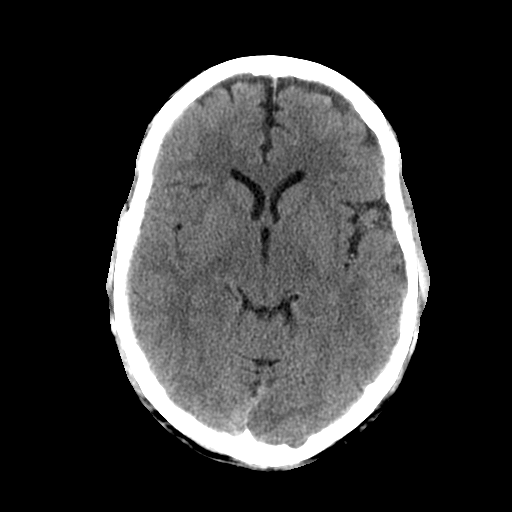
[im 12/32  bone]
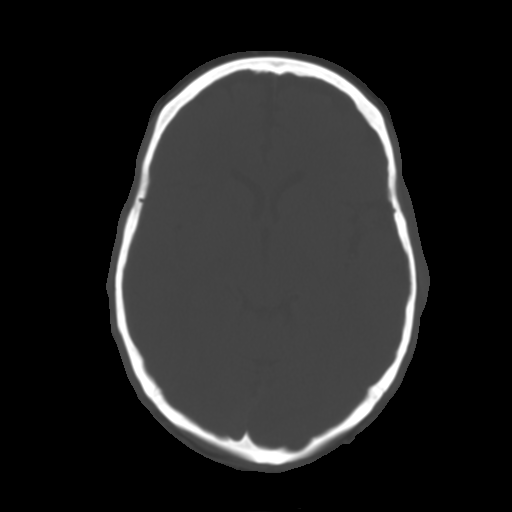
[im 14/32  brain]
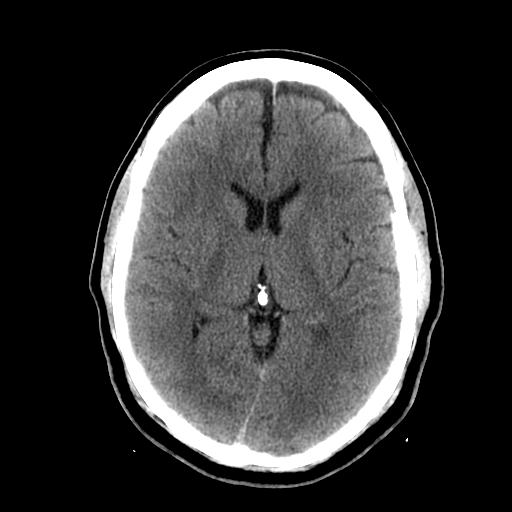
[im 16/32  brain]
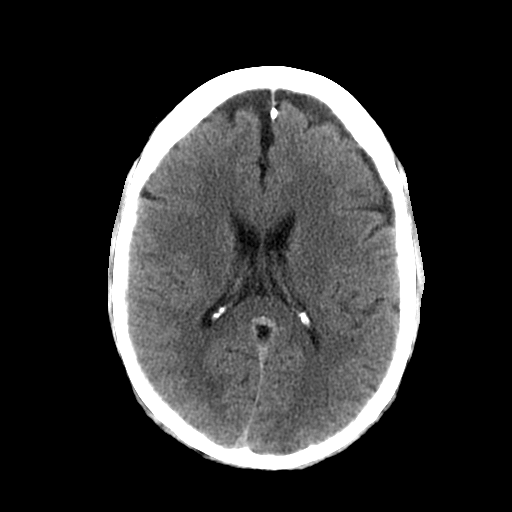
[im 18/32  brain]
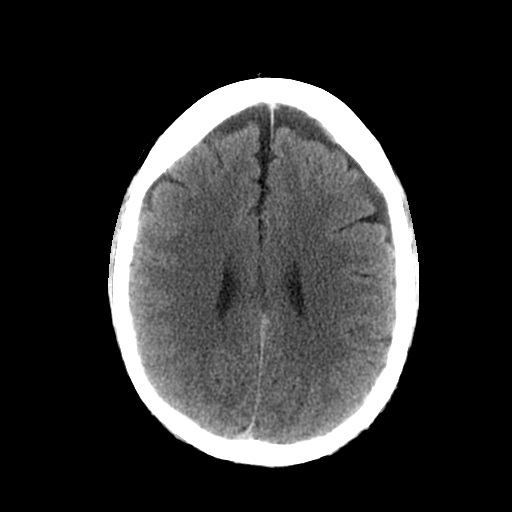
[im 20/32  brain]
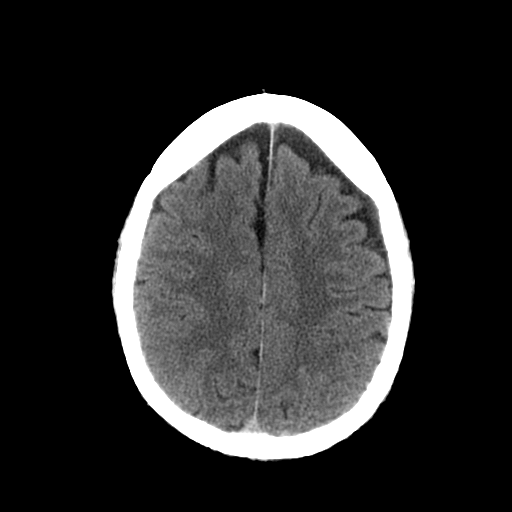
[im 20/32  bone]
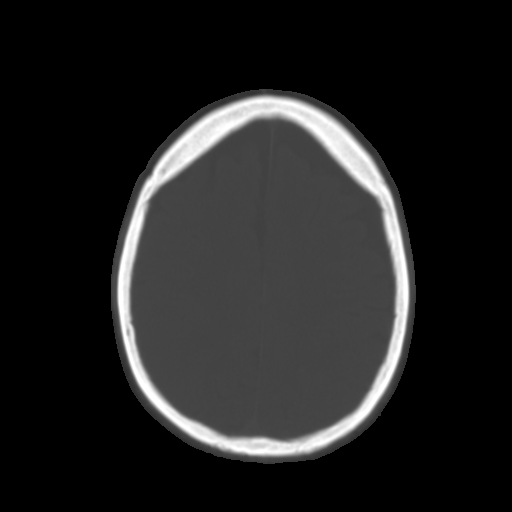
[im 23/32  brain]
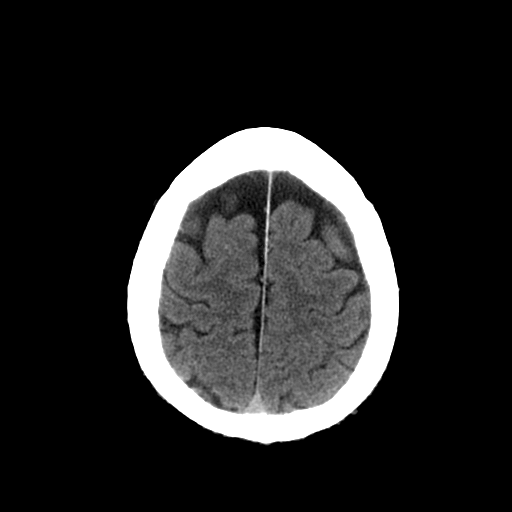
[im 25/32  brain]
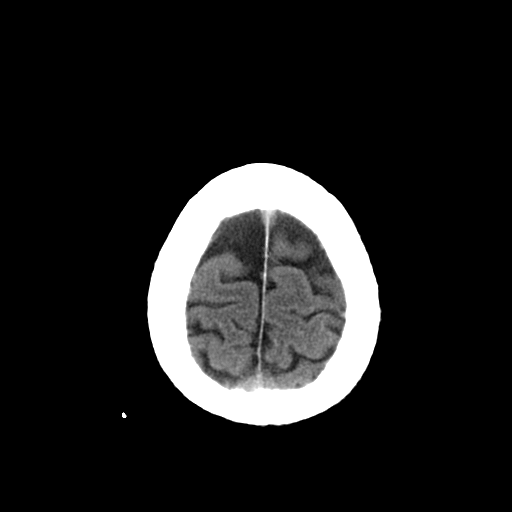
[im 27/32  brain]
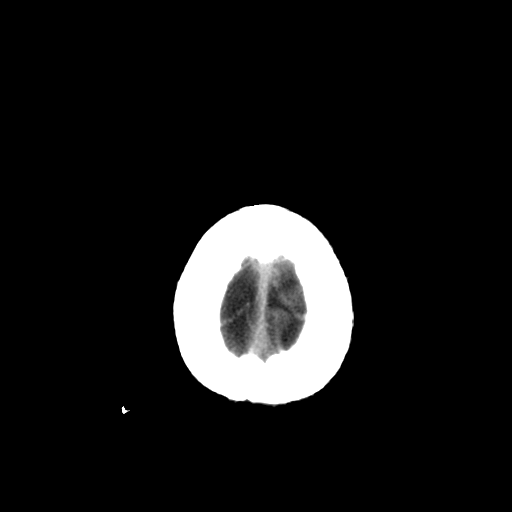
[im 29/32  brain]
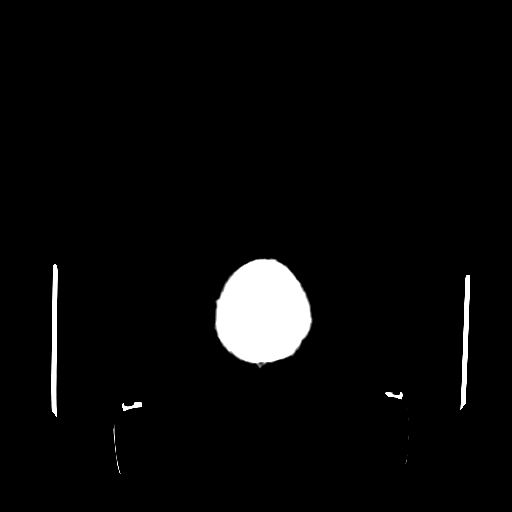
[im 29/32  bone]
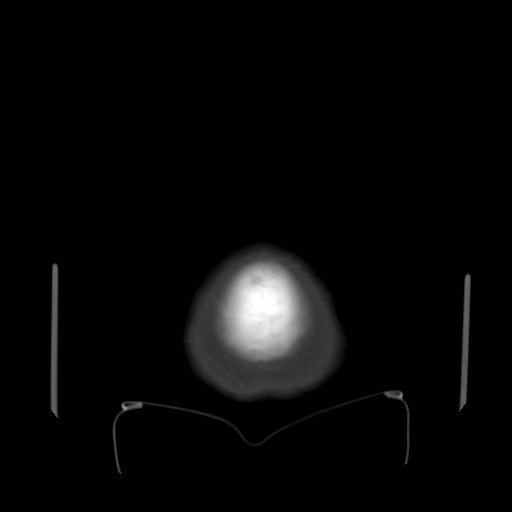

[13 of 30 positions shown; findings below may reference images not displayed]

FINDINGS: Bone windows demonstrate cerumen within the left external
ear canal. Clear paranasal sinuses and mastoid air cells.

Soft tissue windows demonstrate mildly age advanced cerebral
atrophy.  This results in prominence of the extraaxial spaces,
especially adjacent the frontal lobes. No  mass lesion, hemorrhage,
hydrocephalus, acute infarct, intra-axial, or extra-axial fluid
collection.
IMPRESSION: 1. No acute intracranial abnormality.
2.  Mildly age advanced cerebral atrophy.

## 2010-12-23 ENCOUNTER — Emergency Department (HOSPITAL_COMMUNITY)
Admission: EM | Admit: 2010-12-23 | Discharge: 2010-12-23 | Disposition: A | Discharge status: Home / Self Care | Payer: Medicare Other | Attending: Emergency Medicine | Admitting: Emergency Medicine

## 2010-12-23 DIAGNOSIS — L851 Acquired keratosis [keratoderma] palmaris et plantaris: Secondary | ICD-10-CM | POA: Insufficient documentation

## 2010-12-23 DIAGNOSIS — Z79899 Other long term (current) drug therapy: Secondary | ICD-10-CM | POA: Insufficient documentation

## 2010-12-23 DIAGNOSIS — I519 Heart disease, unspecified: Secondary | ICD-10-CM | POA: Insufficient documentation

## 2010-12-23 DIAGNOSIS — Z8673 Personal history of transient ischemic attack (TIA), and cerebral infarction without residual deficits: Secondary | ICD-10-CM | POA: Insufficient documentation

## 2010-12-23 DIAGNOSIS — Z7982 Long term (current) use of aspirin: Secondary | ICD-10-CM | POA: Insufficient documentation

## 2010-12-23 DIAGNOSIS — E669 Obesity, unspecified: Secondary | ICD-10-CM | POA: Insufficient documentation

## 2010-12-23 DIAGNOSIS — I252 Old myocardial infarction: Secondary | ICD-10-CM | POA: Insufficient documentation

## 2011-07-02 IMAGING — NM NM MYOCAR MULTI W/SPECT W/WALL MOTION & EF
1 series · 6 of 6 positions shown · non-contrast
Comparison: 07/01/2006

CLINICAL DATA: 59-year-old male with chest pain.  Personal history
of myocardial infarction and CVA.

MYOCARDIAL IMAGING WITH SPECT (REST AND PHARMACOLOGIC-STRESS)
GATED LEFT VENTRICULAR WALL MOTION STUDY
LEFT VENTRICULAR EJECTION FRACTION
TECHNIQUE: Standard myocardial SPECT imaging was performed after
resting intravenous injection of 10 mCi Jc-JJm tetrofosmin.
Subsequently, intravenous infusion of persantine was performed
under the supervision of the Cardiology staff.  At peak effect of
the drug, 30 mCi Jc-JJm tetrofosmin was injected intravenously and
standard myocardial SPECT  imaging was performed.  Quantitative
gated imaging was also performed to evaluate left ventricular wall
motion, and estimate left ventricular ejection fraction.

[myoview stress · 6.4mm · 6.39mm/px · 6 of 27 frames shown]
[frame 3/27]
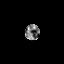
[frame 7/27]
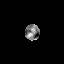
[frame 12/27]
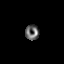
[frame 16/27]
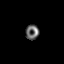
[frame 21/27]
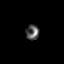
[frame 25/27]
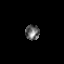

[6 of 6 positions shown; findings below may reference images not displayed]

FINDINGS: A moderate to large apical scar involving the distal
anterior and septal walls is again identified.
There is no abnormal areas of activity to suggest inducible left
ventricular myocardial ischemia.

Hypokinesis of the apex and septum are again identified.

Left ventricular ejection fraction is calculated at 45% with an end-
diastolic volume of 130 ml and end-systolic volume of 71 ml.
IMPRESSION: No evidence of inducible left ventricular myocardial ischemia.

Moderate to large apical scar involving the distal anterior and
septal walls.

Left ventricular ejection fraction calculated at 45%.

## 2011-09-02 ENCOUNTER — Other Ambulatory Visit: Payer: Self-pay | Admitting: Cardiovascular Disease

## 2011-09-02 ENCOUNTER — Ambulatory Visit
Admission: RE | Admit: 2011-09-02 | Discharge: 2011-09-02 | Disposition: A | Discharge status: Home / Self Care | Payer: Medicare Other | Source: Ambulatory Visit | Attending: Cardiovascular Disease | Admitting: Cardiovascular Disease

## 2011-09-02 DIAGNOSIS — R6889 Other general symptoms and signs: Secondary | ICD-10-CM

## 2011-09-23 ENCOUNTER — Other Ambulatory Visit (HOSPITAL_COMMUNITY): Payer: Self-pay | Admitting: Cardiovascular Disease

## 2011-09-23 DIAGNOSIS — I2510 Atherosclerotic heart disease of native coronary artery without angina pectoris: Secondary | ICD-10-CM

## 2011-09-30 ENCOUNTER — Other Ambulatory Visit (HOSPITAL_COMMUNITY): Payer: Self-pay | Admitting: Cardiovascular Disease

## 2011-09-30 ENCOUNTER — Ambulatory Visit (HOSPITAL_COMMUNITY)
Admission: RE | Admit: 2011-09-30 | Discharge: 2011-09-30 | Disposition: A | Discharge status: Home / Self Care | Payer: Medicare Other | Source: Ambulatory Visit | Attending: Cardiovascular Disease | Admitting: Cardiovascular Disease

## 2011-09-30 ENCOUNTER — Other Ambulatory Visit: Payer: Self-pay

## 2011-09-30 ENCOUNTER — Encounter (HOSPITAL_COMMUNITY)
Admission: RE | Admit: 2011-09-30 | Discharge: 2011-09-30 | Disposition: A | Discharge status: Home / Self Care | Payer: Medicare Other | Source: Ambulatory Visit | Attending: Cardiovascular Disease | Admitting: Cardiovascular Disease

## 2011-09-30 DIAGNOSIS — I2510 Atherosclerotic heart disease of native coronary artery without angina pectoris: Secondary | ICD-10-CM

## 2011-09-30 DIAGNOSIS — R079 Chest pain, unspecified: Secondary | ICD-10-CM | POA: Insufficient documentation

## 2011-09-30 MED ORDER — TECHNETIUM TC 99M TETROFOSMIN IV KIT
10.0000 | PACK | Freq: Once | INTRAVENOUS | Status: AC | PRN
  Administered 2011-09-30: 10 via INTRAVENOUS

## 2011-09-30 MED ORDER — TECHNETIUM TC 99M TETROFOSMIN IV KIT
30.0000 | PACK | Freq: Once | INTRAVENOUS | Status: AC | PRN
  Administered 2011-09-30: 30 via INTRAVENOUS

## 2011-09-30 MED ORDER — REGADENOSON 0.4 MG/5ML IV SOLN
INTRAVENOUS | Status: AC
  Filled 2011-09-30: qty 5

## 2011-09-30 MED ORDER — REGADENOSON 0.4 MG/5ML IV SOLN
0.4000 mg | Freq: Once | INTRAVENOUS | Status: AC
  Administered 2011-09-30: 0.4 mg via INTRAVENOUS

## 2012-05-10 ENCOUNTER — Ambulatory Visit
Admission: RE | Admit: 2012-05-10 | Discharge: 2012-05-10 | Disposition: A | Discharge status: Home / Self Care | Payer: Medicare Other | Source: Ambulatory Visit | Attending: Cardiovascular Disease | Admitting: Cardiovascular Disease

## 2012-05-10 ENCOUNTER — Other Ambulatory Visit: Payer: Self-pay | Admitting: Cardiovascular Disease

## 2012-05-10 DIAGNOSIS — R609 Edema, unspecified: Secondary | ICD-10-CM

## 2012-07-14 ENCOUNTER — Ambulatory Visit
Admission: RE | Admit: 2012-07-14 | Discharge: 2012-07-14 | Disposition: A | Discharge status: Home / Self Care | Payer: Medicare Other | Source: Ambulatory Visit | Attending: Cardiovascular Disease | Admitting: Cardiovascular Disease

## 2012-07-14 ENCOUNTER — Other Ambulatory Visit: Payer: Self-pay | Admitting: Cardiovascular Disease

## 2012-07-14 DIAGNOSIS — R1032 Left lower quadrant pain: Secondary | ICD-10-CM

## 2012-10-19 IMAGING — CR DG CERVICAL SPINE COMPLETE 4+V
6 series · 6 of 6 positions shown · non-contrast
Comparison: None.

CLINICAL DATA: Cold feeling over the left shoulder and arm

CERVICAL SPINE - COMPLETE 4+ VIEW

[w c-spine lat]
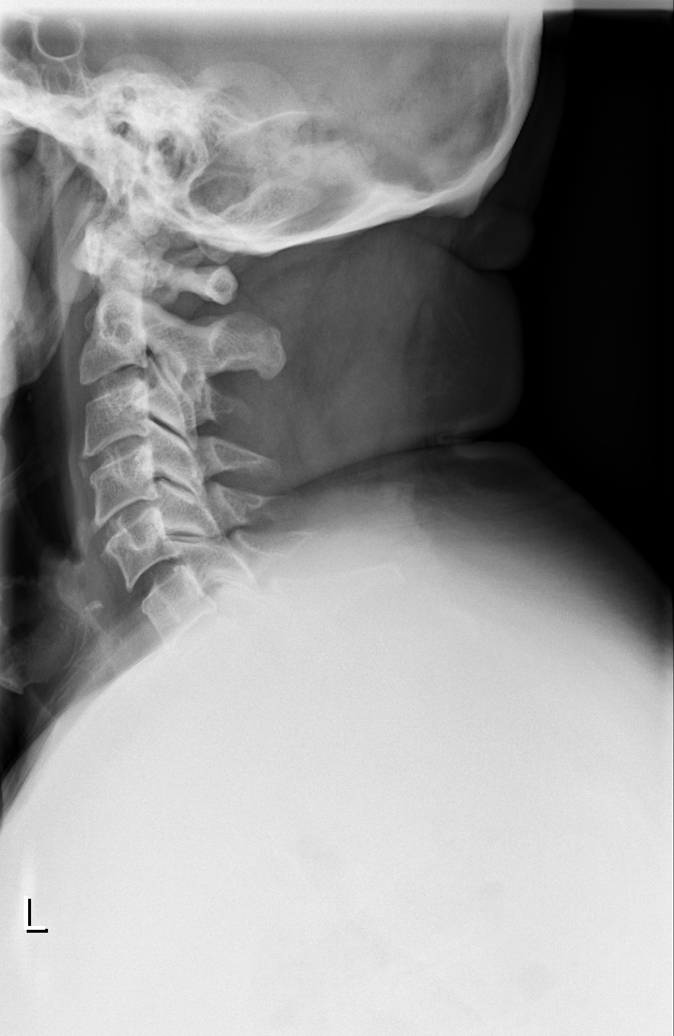

[w c-spine oblique (1 of 2)]
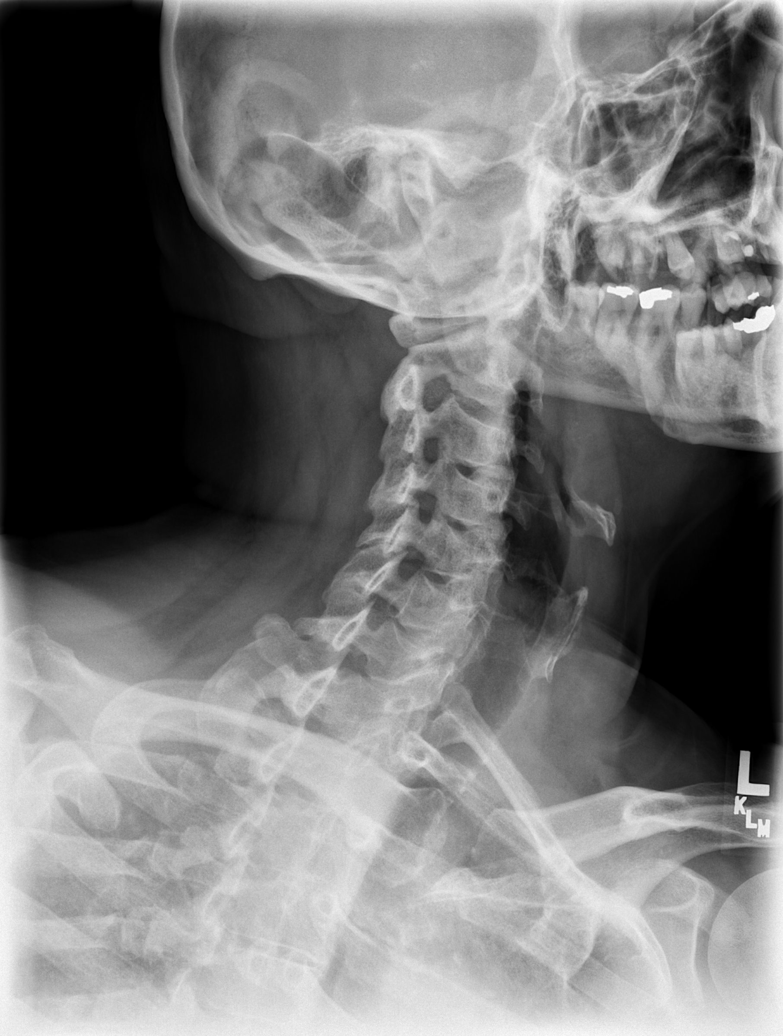

[w c-spine oblique (2 of 2)]
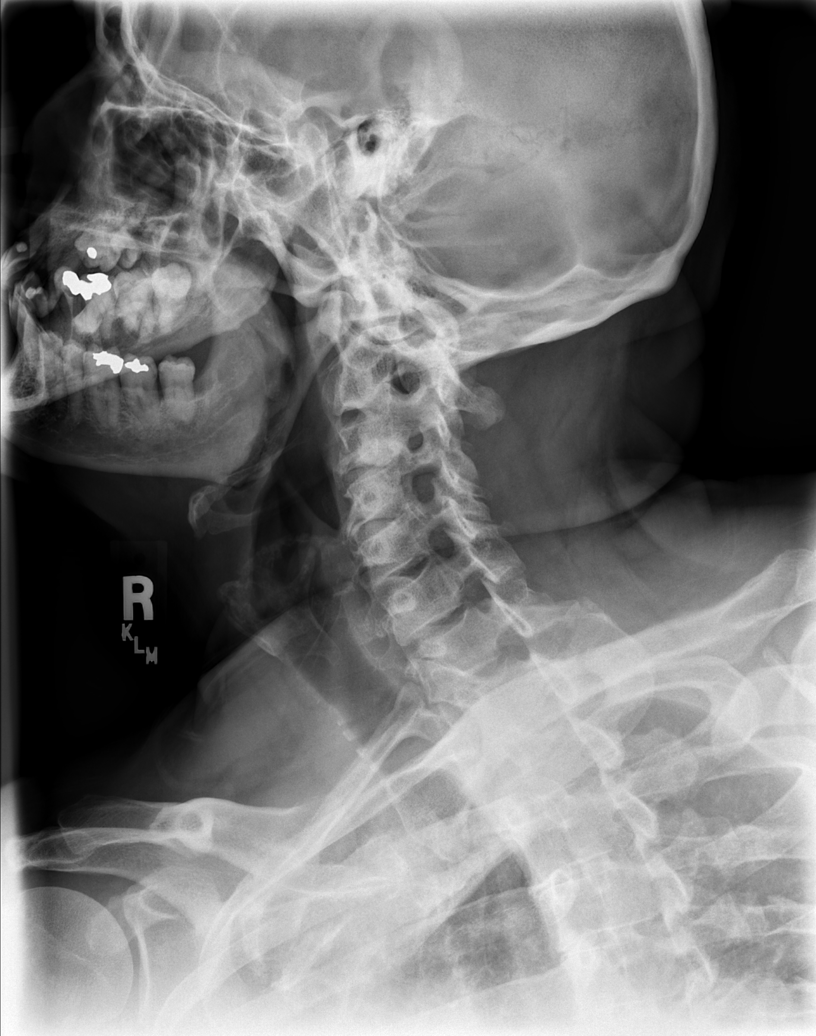

[w c-spine a.p. *]
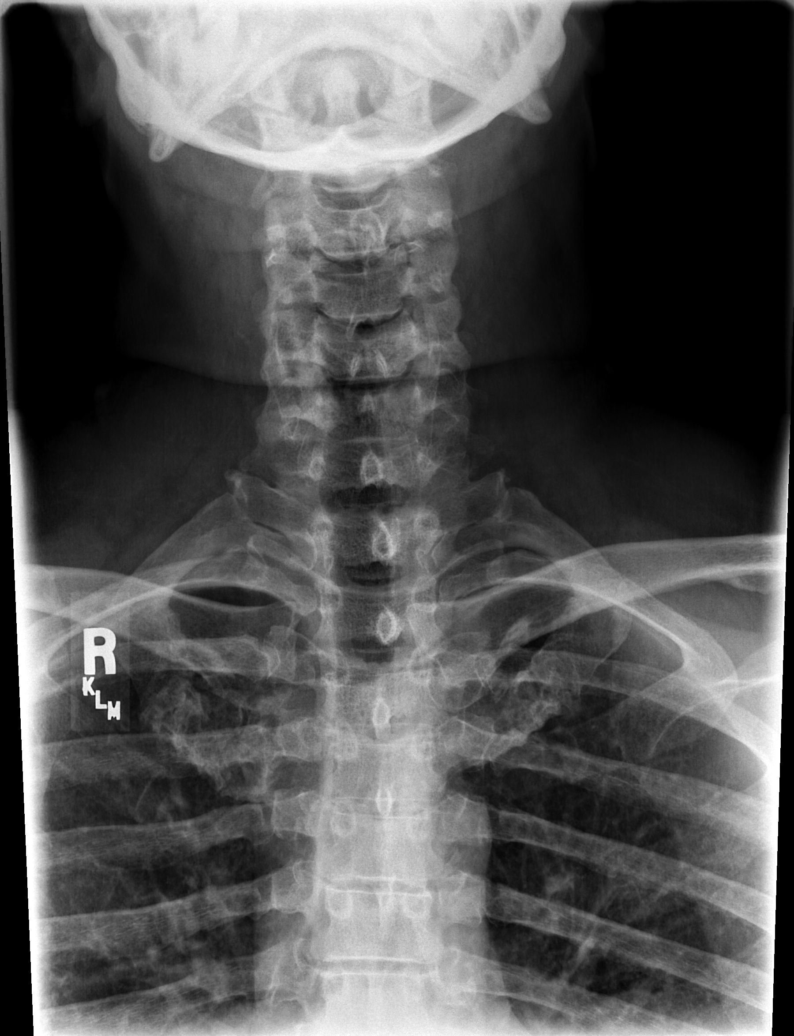

[w c-spine odontoid *]
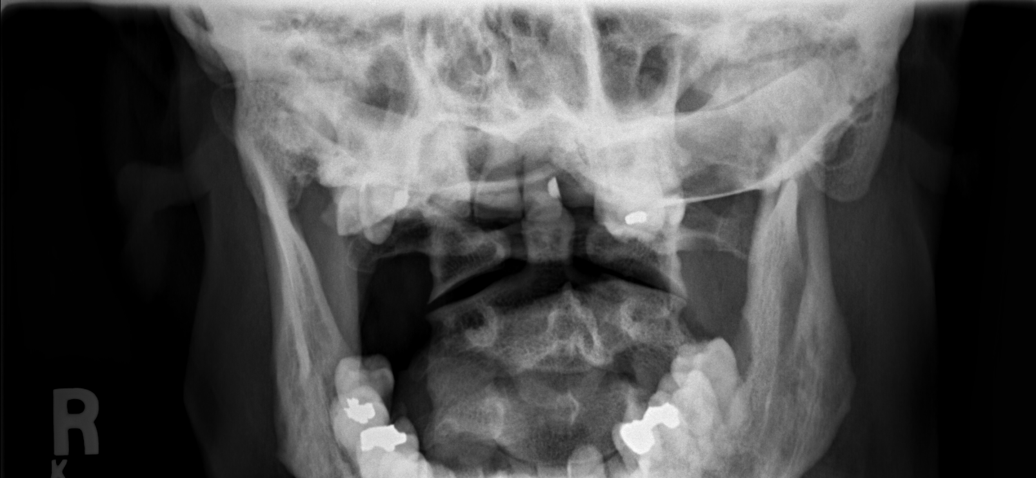

[w swimmers view *]
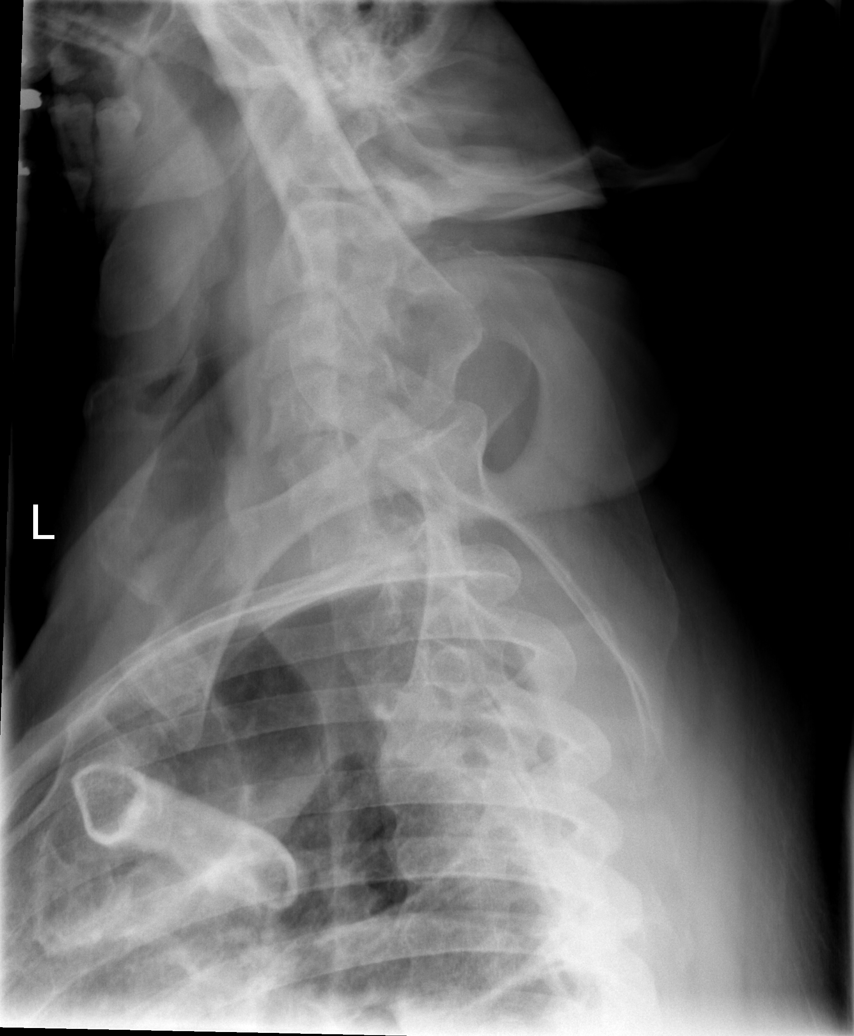

[6 of 6 positions shown; findings below may reference images not displayed]

FINDINGS: The cervical vertebrae are in normal alignment.
Intervertebral disc spaces appear normal.  No prevertebral soft
tissue swelling is seen.  On oblique views the foramina are patent.
The odontoid process is intact.  The lung apices are clear.
IMPRESSION: Normal alignment.  No foraminal narrowing.

## 2012-11-22 ENCOUNTER — Other Ambulatory Visit: Payer: Self-pay | Admitting: Cardiovascular Disease

## 2012-11-22 ENCOUNTER — Ambulatory Visit
Admission: RE | Admit: 2012-11-22 | Discharge: 2012-11-22 | Disposition: A | Discharge status: Home / Self Care | Payer: Medicare Other | Source: Ambulatory Visit | Attending: Cardiovascular Disease | Admitting: Cardiovascular Disease

## 2012-11-22 DIAGNOSIS — R0602 Shortness of breath: Secondary | ICD-10-CM

## 2013-02-12 ENCOUNTER — Encounter: Payer: Self-pay | Admitting: Podiatry

## 2013-02-12 ENCOUNTER — Ambulatory Visit (INDEPENDENT_AMBULATORY_CARE_PROVIDER_SITE_OTHER): Payer: Medicare Other | Admitting: Podiatry

## 2013-02-12 VITALS — BP 107/52 | HR 70 | Resp 18

## 2013-02-12 DIAGNOSIS — B351 Tinea unguium: Secondary | ICD-10-CM

## 2013-02-12 DIAGNOSIS — M79609 Pain in unspecified limb: Secondary | ICD-10-CM

## 2013-02-12 NOTE — Progress Notes (Signed)
  Subjective:    Patient ID: Rickey Hutchinson, male    DOB: [birth year], 62 y.o.   MRN: [id number]  HPI this patient presents periodically complaining of painful hallux toenails.    Review of Systems  Constitutional: Negative.   HENT: Negative.   Eyes: Negative.   Respiratory:       Bronchitis last month/breathing and standing   Cardiovascular: Negative.   Gastrointestinal: Negative.   Endocrine: Negative.   Genitourinary: Negative.   Musculoskeletal: Negative.   Skin: Negative.   Allergic/Immunologic: Negative.   Neurological: Negative.   Hematological: Negative.   Psychiatric/Behavioral: Negative.        Objective:   Physical Exam  Hypertrophic brittle hallux toenail is noted bilaterally.       Assessment & Plan:  Symptomatic onychomycoses x2  The hallux nails were debrided back without any bleeding. Reappoint when necessary at patient's request.  Marcelline Temkin C.Leeanne Deed, DPM

## 2013-03-23 ENCOUNTER — Encounter (HOSPITAL_COMMUNITY): Payer: Self-pay | Admitting: Emergency Medicine

## 2013-03-23 ENCOUNTER — Inpatient Hospital Stay (HOSPITAL_COMMUNITY)
Admission: EM | Admit: 2013-03-23 | Discharge: 2013-03-25 | DRG: 379 | Disposition: A | Discharge status: Home / Self Care | Payer: Medicare Other | Attending: Cardiovascular Disease | Admitting: Cardiovascular Disease

## 2013-03-23 DIAGNOSIS — Z9861 Coronary angioplasty status: Secondary | ICD-10-CM

## 2013-03-23 DIAGNOSIS — F17200 Nicotine dependence, unspecified, uncomplicated: Secondary | ICD-10-CM | POA: Diagnosis present

## 2013-03-23 DIAGNOSIS — K922 Gastrointestinal hemorrhage, unspecified: Principal | ICD-10-CM | POA: Diagnosis present

## 2013-03-23 DIAGNOSIS — Z8249 Family history of ischemic heart disease and other diseases of the circulatory system: Secondary | ICD-10-CM

## 2013-03-23 DIAGNOSIS — Z6833 Body mass index (BMI) 33.0-33.9, adult: Secondary | ICD-10-CM

## 2013-03-23 DIAGNOSIS — Z8673 Personal history of transient ischemic attack (TIA), and cerebral infarction without residual deficits: Secondary | ICD-10-CM

## 2013-03-23 DIAGNOSIS — Z79899 Other long term (current) drug therapy: Secondary | ICD-10-CM

## 2013-03-23 DIAGNOSIS — K625 Hemorrhage of anus and rectum: Secondary | ICD-10-CM

## 2013-03-23 DIAGNOSIS — I10 Essential (primary) hypertension: Secondary | ICD-10-CM | POA: Diagnosis present

## 2013-03-23 DIAGNOSIS — E669 Obesity, unspecified: Secondary | ICD-10-CM | POA: Diagnosis present

## 2013-03-23 DIAGNOSIS — Z7982 Long term (current) use of aspirin: Secondary | ICD-10-CM

## 2013-03-23 DIAGNOSIS — J449 Chronic obstructive pulmonary disease, unspecified: Secondary | ICD-10-CM | POA: Diagnosis present

## 2013-03-23 DIAGNOSIS — K644 Residual hemorrhoidal skin tags: Secondary | ICD-10-CM

## 2013-03-23 DIAGNOSIS — J4489 Other specified chronic obstructive pulmonary disease: Secondary | ICD-10-CM | POA: Diagnosis present

## 2013-03-23 DIAGNOSIS — I509 Heart failure, unspecified: Secondary | ICD-10-CM | POA: Diagnosis present

## 2013-03-23 DIAGNOSIS — I2510 Atherosclerotic heart disease of native coronary artery without angina pectoris: Secondary | ICD-10-CM | POA: Diagnosis present

## 2013-03-23 LAB — CBC
HCT: 49.4 % (ref 39.0–52.0)
MCH: 32.7 pg (ref 26.0–34.0)
MCV: 93.4 fL (ref 78.0–100.0)
Platelets: 258 10*3/uL (ref 150–400)
RDW: 13.8 % (ref 11.5–15.5)

## 2013-03-23 LAB — TYPE AND SCREEN
ABO/RH(D): A POS
Antibody Screen: NEGATIVE

## 2013-03-23 LAB — COMPREHENSIVE METABOLIC PANEL
AST: 28 U/L (ref 0–37)
Albumin: 3.7 g/dL (ref 3.5–5.2)
BUN: 10 mg/dL (ref 6–23)
CO2: 24 mEq/L (ref 19–32)
Calcium: 9.5 mg/dL (ref 8.4–10.5)
Creatinine, Ser: 1.01 mg/dL (ref 0.50–1.35)
GFR calc non Af Amer: 78 mL/min — ABNORMAL LOW (ref >90)
Total Bilirubin: 0.2 mg/dL — ABNORMAL LOW (ref 0.3–1.2)

## 2013-03-23 MED ORDER — ALUM & MAG HYDROXIDE-SIMETH 200-200-20 MG/5ML PO SUSP: 30.0000 mL | Freq: Four times a day (QID) | ORAL | Status: DC | PRN

## 2013-03-23 MED ORDER — ONDANSETRON HCL 4 MG/2ML IJ SOLN: 4.0000 mg | Freq: Four times a day (QID) | INTRAMUSCULAR | Status: DC | PRN

## 2013-03-23 MED ORDER — ACETAMINOPHEN 325 MG PO TABS: 650.0000 mg | ORAL_TABLET | Freq: Four times a day (QID) | ORAL | Status: DC | PRN

## 2013-03-23 MED ORDER — CARVEDILOL PHOSPHATE ER 20 MG PO CP24
20.0000 mg | ORAL_CAPSULE | Freq: Every day | ORAL | Status: DC
  Administered 2013-03-25: 10:00:00 20 mg via ORAL
  Filled 2013-03-23 (×2): qty 1

## 2013-03-23 MED ORDER — DOCUSATE SODIUM 100 MG PO CAPS
100.0000 mg | ORAL_CAPSULE | Freq: Two times a day (BID) | ORAL | Status: DC
  Filled 2013-03-23 (×4): qty 1

## 2013-03-23 MED ORDER — SODIUM CHLORIDE 0.9 % IJ SOLN
3.0000 mL | Freq: Two times a day (BID) | INTRAMUSCULAR | Status: DC
  Administered 2013-03-24 – 2013-03-25 (×4): 3 mL via INTRAVENOUS

## 2013-03-23 MED ORDER — EZETIMIBE 10 MG PO TABS
10.0000 mg | ORAL_TABLET | Freq: Every day | ORAL | Status: DC
  Administered 2013-03-25: 10 mg via ORAL
  Filled 2013-03-23 (×2): qty 1

## 2013-03-23 MED ORDER — NITROGLYCERIN 0.4 MG SL SUBL: 0.4000 mg | SUBLINGUAL_TABLET | SUBLINGUAL | Status: DC | PRN

## 2013-03-23 MED ORDER — ONDANSETRON HCL 4 MG PO TABS: 4.0000 mg | ORAL_TABLET | Freq: Four times a day (QID) | ORAL | Status: DC | PRN

## 2013-03-23 MED ORDER — ACETAMINOPHEN 650 MG RE SUPP: 650.0000 mg | Freq: Four times a day (QID) | RECTAL | Status: DC | PRN

## 2013-03-23 NOTE — ED Notes (Signed)
Pt sitting in recline, pt states this is comfortable for him.

## 2013-03-23 NOTE — ED Notes (Signed)
Per pt sts since eating spaghetti yesterday he has had burning stools and 3 x of rectal bleeding. sts pain in lower abdomen. Denies dizziness, chest pain, SOB.

## 2013-03-23 NOTE — ED Provider Notes (Signed)
CSN: [id number]     Arrival date & time 03/23/13  1326 History   First MD Initiated Contact with Patient 03/23/13 1619     Chief Complaint  Patient presents with  . Diarrhea  . Rectal Bleeding   (Consider location/radiation/quality/duration/timing/severity/associated sxs/prior Treatment) Patient is a 62 y.o. male presenting with diarrhea and hematochezia.  Diarrhea Rectal Bleeding  Pt with history of stroke and CAD no longer on blood thinners but taking ASA BID reports loose stools since last night after eating spicy spaghetti. He has had bright red blood per rectum x 3 during the day today. Mild lower abdominal pain, no fever, no vomiting. States rectum is 'burning' after BM but no pain with defecation.   Past Medical History  Diagnosis Date  . COPD (chronic obstructive pulmonary disease)   . CHF (congestive heart failure)   . Stroke    Past Surgical History  Procedure Laterality Date  . Stents      3 stents in heart since 2005  . Appendectomy     Family History  Problem Relation Age of Onset  . Cancer Mother   . Congestive Heart Failure Father    History  Substance Use Topics  . Smoking status: Current Every Day Smoker -- 1.00 packs/day    Types: Cigarettes  . Smokeless tobacco: Never Used  . Alcohol Use: No    Review of Systems  Gastrointestinal: Positive for diarrhea and hematochezia.   All other systems reviewed and are negative except as noted in HPI.   Allergies  Review of patient's allergies indicates no known allergies.  Home Medications   Current Outpatient Rx  Name  Route  Sig  Dispense  Refill  . aspirin 81 MG tablet   Oral   Take 162 mg by mouth daily.          . carvedilol (COREG CR) 20 MG 24 hr capsule   Oral   Take 20 mg by mouth daily.         Marland Kitchen ezetimibe (ZETIA) 10 MG tablet   Oral   Take 10 mg by mouth daily.         . furosemide (LASIX) 40 MG tablet   Oral   Take 40 mg by mouth.         . nitroGLYCERIN (NITROSTAT) 0.4 MG  SL tablet   Sublingual   Place 0.4 mg under the tongue every 5 (five) minutes as needed for chest pain.         . potassium chloride (K-DUR) 10 MEQ tablet   Oral   Take 10 mEq by mouth daily.           BP 110/60  Pulse 71  Temp(Src) 98.2 F (36.8 C) (Oral)  Resp 11  Ht 5\' 10"  (1.778 m)  Wt 246 lb 4.8 oz (111.721 kg)  BMI 35.34 kg/m2  SpO2 99% Physical Exam  Nursing note and vitals reviewed. Constitutional: He is oriented to person, place, and time. He appears well-developed and well-nourished.  HENT:  Head: Normocephalic and atraumatic.  Eyes: EOM are normal. Pupils are equal, round, and reactive to light.  Neck: Normal range of motion. Neck supple.  Cardiovascular: Normal rate, normal heart sounds and intact distal pulses.   Pulmonary/Chest: Effort normal and breath sounds normal.  Abdominal: Bowel sounds are normal. He exhibits no distension. There is no tenderness. There is no rebound and no guarding.  Genitourinary:  Several large, non-thrombosed hemorrhoids, no definite bleeding from hemorrhoids, but some maroon blood  in peri-rectal area and red blood on digital exam  Musculoskeletal: Normal range of motion. He exhibits no edema and no tenderness.  Neurological: He is alert and oriented to person, place, and time. He has normal strength. No cranial nerve deficit or sensory deficit.  Skin: Skin is warm and dry. No rash noted.  Psychiatric: He has a normal mood and affect.    ED Course  Procedures (including critical care time) Labs Review Labs Reviewed  CBC - Abnormal; Notable for the following:    WBC 14.4 (*)    Hemoglobin 17.3 (*)    All other components within normal limits  COMPREHENSIVE METABOLIC PANEL - Abnormal; Notable for the following:    Total Bilirubin 0.2 (*)    GFR calc non Af Amer 78 (*)    All other components within normal limits  OCCULT BLOOD, POC DEVICE - Abnormal; Notable for the following:    Fecal Occult Bld POSITIVE (*)    All other  components within normal limits  PROTIME-INR  TYPE AND SCREEN  ABO/RH   Imaging Review No results found.  EKG Interpretation   None       MDM   1. Rectal bleeding   2. External hemorrhoids     Hgb is not low however patient describes a significant blood loss. No further episodes of bleeding since arrival here. Discussed with Dr. Algie Coffer who will admit the patient for observation.    Charles B. Bernette Mayers, MD 03/23/13 [phone]

## 2013-03-23 NOTE — H&P (Signed)
Rickey Hutchinson is an 62 y.o. male.   Chief Complaint: Rectal bleed HPI: 62 year old male with history of stroke and CAD reports loose stools since last night after eating spicy spaghetti. He has had bright red blood per rectum x 3 during the day today. Mild lower abdominal pain, no fever, no vomiting. He is no longer on blood thinners but has been taking aspirin twice daily.   Past Medical History  Diagnosis Date  . COPD (chronic obstructive pulmonary disease)   . CHF (congestive heart failure)   . Stroke       Past Surgical History  Procedure Laterality Date  . Stents      3 stents in heart since 2005  . Appendectomy      Family History  Problem Relation Age of Onset  . Cancer Mother   . Congestive Heart Failure Father    Social History:  reports that he has been smoking Cigarettes.  He has been smoking about 1.00 pack per day. He has never used smokeless tobacco. He reports that he does not drink alcohol or use illicit drugs.  Allergies: No Known Allergies   (Not in a hospital admission)  Results for orders placed during the hospital encounter of 03/23/13 (from the past 48 hour(s))  PROTIME-INR     Status: None   Collection Time    03/23/13  4:35 PM      Result Value Range   Prothrombin Time 12.6  11.6 - 15.2 seconds   INR 0.96  0.00 - 1.49  CBC     Status: Abnormal   Collection Time    03/23/13  4:35 PM      Result Value Range   WBC 14.4 (*) 4.0 - 10.5 K/uL   RBC 5.29  4.22 - 5.81 MIL/uL   Hemoglobin 17.3 (*) 13.0 - 17.0 g/dL   HCT 08.6  57.8 - 46.9 %   MCV 93.4  78.0 - 100.0 fL   MCH 32.7  26.0 - 34.0 pg   MCHC 35.0  30.0 - 36.0 g/dL   RDW 62.9  52.8 - 41.3 %   Platelets 258  150 - 400 K/uL  COMPREHENSIVE METABOLIC PANEL     Status: Abnormal   Collection Time    03/23/13  4:35 PM      Result Value Range   Sodium 135  135 - 145 mEq/L   Potassium 4.0  3.5 - 5.1 mEq/L   Chloride 98  96 - 112 mEq/L   CO2 24  19 - 32 mEq/L   Glucose, Bld 81  70 - 99 mg/dL    BUN 10  6 - 23 mg/dL   Creatinine, Ser 2.44  0.50 - 1.35 mg/dL   Calcium 9.5  8.4 - 01.0 mg/dL   Total Protein 7.8  6.0 - 8.3 g/dL   Albumin 3.7  3.5 - 5.2 g/dL   AST 28  0 - 37 U/L   ALT 30  0 - 53 U/L   Alkaline Phosphatase 64  39 - 117 U/L   Total Bilirubin 0.2 (*) 0.3 - 1.2 mg/dL   GFR calc non Af Amer 78 (*) >90 mL/min   GFR calc Af Amer >90  >90 mL/min   Comment: (NOTE)     The eGFR has been calculated using the CKD EPI equation.     This calculation has not been validated in all clinical situations.     eGFR's persistently <90 mL/min signify possible Chronic Kidney  Disease.  TYPE AND SCREEN     Status: None   Collection Time    03/23/13  4:40 PM      Result Value Range   ABO/RH(D) A POS     Antibody Screen NEG     Sample Expiration 03/26/2013    ABO/RH     Status: None   Collection Time    03/23/13  4:40 PM      Result Value Range   ABO/RH(D) A POS    OCCULT BLOOD, POC DEVICE     Status: Abnormal   Collection Time    03/23/13  4:46 PM      Result Value Range   Fecal Occult Bld POSITIVE (*) NEGATIVE   No results found.  ROS No weight loss, Wears reading glasses, Positive COPD, + Stroke, + hypertension, + hyprtlipidemia, + abdominal pain. No hepatitis. Blood pressure 128/73, pulse 71, temperature 98.1 F (36.7 C), temperature source Oral, resp. rate 17, height 5\' 10"  (1.778 m), weight 111.721 kg (246 lb 4.8 oz), SpO2 99.00%.  Physical Exam   Constitutional: He is oriented to person, place, and time. He appears well-developed and well-nourished.  HENT: Normocephalic and atraumatic, Hazel eyes, Conj-pink, Sclera-white. Pupils are equal, round, and reactive to light.  Neck: Normal range of motion. Neck supple.  Cardiovascular: Normal rate, normal heart sounds and intact distal pulses.  Pulmonary/Chest: Effort normal and breath sounds normal.  Abdominal: Bowel sounds are normal. He exhibits no distension. There is mild LLQ tenderness. There is no rebound and no  guarding.  Genitourinary: Several large, non-thrombosed hemorrhoids, no definite bleeding from hemorrhoids, but some maroon blood in peri-rectal area and red blood on digital exam per ER physician. Musculoskeletal: Normal range of motion. He exhibits no edema and no tenderness.  Neurological: He is alert and oriented to person, place, and time. He has normal strength. No cranial nerve deficit or sensory deficit.  Skin: Skin is warm and dry. No rash noted.  Psychiatric: He has a normal mood and affect.    Assessment/Plan Acute lower GI bleed CAD Hypertension COPD Obesity  Admit/Monitor Hgb. GI consult IP or OP  Jsaon Yoo S 03/23/2013, 7:35 PM

## 2013-03-23 NOTE — ED Notes (Signed)
Dr Sheldon at bedside  

## 2013-03-24 ENCOUNTER — Inpatient Hospital Stay (HOSPITAL_COMMUNITY): Payer: Medicare Other

## 2013-03-24 DIAGNOSIS — K644 Residual hemorrhoidal skin tags: Secondary | ICD-10-CM

## 2013-03-24 DIAGNOSIS — K625 Hemorrhage of anus and rectum: Secondary | ICD-10-CM

## 2013-03-24 LAB — BASIC METABOLIC PANEL
Chloride: 100 mEq/L (ref 96–112)
Creatinine, Ser: 1.06 mg/dL (ref 0.50–1.35)
GFR calc Af Amer: 86 mL/min — ABNORMAL LOW (ref >90)
GFR calc non Af Amer: 74 mL/min — ABNORMAL LOW (ref >90)
Potassium: 3.6 mEq/L (ref 3.5–5.1)
Sodium: 137 mEq/L (ref 135–145)

## 2013-03-24 LAB — CBC
HCT: 45.6 % (ref 39.0–52.0)
MCHC: 34.9 g/dL (ref 30.0–36.0)
MCV: 93.8 fL (ref 78.0–100.0)
RBC: 4.86 MIL/uL (ref 4.22–5.81)
RDW: 14 % (ref 11.5–15.5)
WBC: 13.5 10*3/uL — ABNORMAL HIGH (ref 4.0–10.5)

## 2013-03-24 MED ORDER — IOHEXOL 300 MG/ML  SOLN
25.0000 mL | INTRAMUSCULAR | Status: AC
  Administered 2013-03-24 (×2): 25 mL via ORAL

## 2013-03-24 NOTE — Progress Notes (Signed)
Pt refused application of SCD's.  Pt notified of the risks, and verbalized understanding.

## 2013-03-24 NOTE — Consult Note (Signed)
[facility] Referring Provider: No ref. provider found Primary Care Physician:  [name], MD Primary Gastroenterologist:  None  Reason for Consultation: Rectal bleeding   HPI:  Rickey Hutchinson is a 62 y.o. male admitted with minor rectal bleeding on single occasion.  No particularly hard stools recently.  Blood was red, no associated anal pain.  He takes ASA daily but no other blood thinners.  He tells me he had a "blockage in bowels recently" treated with mag citrate as out patient..  Only abd films I seen in [facility] is KUB 07/2012 which showed moderate stool burden but no obstruction. No FH of colon cancer or colon polyps.  He feels well otherwise. No bleeding since in hosp for past 12-24 hours.   Past Medical History  Diagnosis Date  . COPD (chronic obstructive pulmonary disease)   . CHF (congestive heart failure)   . Stroke     Past Surgical History  Procedure Laterality Date  . Stents      3 stents in heart since 2005  . Appendectomy      Prior to Admission medications   Medication Sig Start Date End Date Taking? Authorizing Provider  aspirin 81 MG tablet Take 162 mg by mouth daily.    Yes Historical Provider, MD  carvedilol (COREG CR) 20 MG 24 hr capsule Take 20 mg by mouth daily.   Yes Historical Provider, MD  ezetimibe (ZETIA) 10 MG tablet Take 10 mg by mouth daily.   Yes Historical Provider, MD  furosemide (LASIX) 40 MG tablet Take 40 mg by mouth.   Yes Historical Provider, MD  nitroGLYCERIN (NITROSTAT) 0.4 MG SL tablet Place 0.4 mg under the tongue every 5 (five) minutes as needed for chest pain.   Yes Historical Provider, MD  potassium chloride (K-DUR) 10 MEQ tablet Take 10 mEq by mouth daily.    Yes Historical Provider, MD    Current Facility-Administered Medications  Medication Dose Route Frequency Provider Last Rate Last Dose  . acetaminophen (TYLENOL) tablet 650 mg  650 mg Oral Q6H PRN [name], MD       Or  . acetaminophen (TYLENOL)  suppository 650 mg  650 mg Rectal Q6H PRN [name], MD      . alum & mag hydroxide-simeth (MAALOX/MYLANTA) 200-200-20 MG/5ML suspension 30 mL  30 mL Oral Q6H PRN [name], MD      . carvedilol (COREG CR) 24 hr capsule 20 mg  20 mg Oral Daily [name], MD      . docusate sodium (COLACE) capsule 100 mg  100 mg Oral BID [name], MD      . ezetimibe (ZETIA) tablet 10 mg  10 mg Oral Daily [name], MD      . nitroGLYCERIN (NITROSTAT) SL tablet 0.4 mg  0.4 mg Sublingual Q5 min PRN [name], MD      . ondansetron (ZOFRAN) tablet 4 mg  4 mg Oral Q6H PRN [name], MD       Or  . ondansetron (ZOFRAN) injection 4 mg  4 mg Intravenous Q6H PRN [name], MD      . sodium chloride 0.9 % injection 3 mL  3 mL Intravenous Q12H [name], MD   3 mL at 03/24/13 1000    Allergies as of 03/23/2013  . (No Known Allergies)    Family History  Problem Relation Age of Onset  . Cancer Mother   . Congestive  Heart Failure Father     History   Social History  . Marital Status: Widowed    Spouse Name: N/A    Number of Children: N/A  . Years of Education: N/A   Occupational History  . Not on file.   Social History Main Topics  . Smoking status: Current Every Day Smoker -- 1.00 packs/day    Types: Cigarettes  . Smokeless tobacco: Never Used  . Alcohol Use: No  . Drug Use: No  . Sexual Activity: Not on file   Other Topics Concern  . Not on file   Social History Narrative  . No narrative on file     Review of Systems: Pertinent positive and negative review of systems were noted in the above HPI section. Complete review of systems was performed and was otherwise normal.   Physical Exam: Vital signs in last 24 hours: Temp:  [97.3 F (36.3 C)-98.4 F (36.9 C)] 98.4 F (36.9 C) (11/22 0711) Pulse Rate:  [71-91] 85 (11/22 0722) Resp:  [11-24] 16 (11/22 0711) BP: (97-137)/(59-94) 108/69 mmHg (11/22 0722) SpO2:  [96 %-100 %] 96 % (11/22  0711) Weight:  [235 lb 12.8 oz (106.958 kg)-246 lb 4.8 oz (111.721 kg)] 235 lb 12.8 oz (106.958 kg) (11/22 0500) Last BM Date: 03/23/13 Constitutional: generally well-appearing Psychiatric: alert and oriented x3 Eyes: extraocular movements intact Mouth: oral pharynx moist, no lesions Neck: supple no lymphadenopathy Cardiovascular: heart regular rate and rhythm Lungs: clear to auscultation bilaterally Abdomen: soft, nontender, nondistended, no obvious ascites, no peritoneal signs, normal bowel sounds Extremities: no lower extremity edema bilaterally Skin: no lesions on visible extremities Rectal exam deferred for upcoming colonoscopy: per ER he had hemorrhoids without thombosis  Lab Results:  Recent Labs  03/23/13 1635 03/24/13 0358  WBC 14.4* 13.5*  HGB 17.3* 15.9  HCT 49.4 45.6  PLT 258 254  MCV 93.4 93.8   BMET  Recent Labs  03/23/13 1635 03/24/13 0358  NA 135 137  K 4.0 3.6  CL 98 100  CO2 24 26  GLUCOSE 81 81  BUN 10 10  CREATININE 1.01 1.06  CALCIUM 9.5 9.2   LFT  Recent Labs  03/23/13 1635  BILITOT 0.2*  AST 28  ALT 30  ALKPHOS 64  PROT 7.8  ALBUMIN 3.7   PT/INR  Recent Labs  03/23/13 1635  LABPROT 12.6  INR 0.96   Impression/Plan: 62 y.o. male with minor rectal bleeding.  Likely hemorrhoidal however I recommended colonoscopy. He prefers this to be done as OP which I think is safe.  I will alert my office to contact him early this coming week about outpatient colonoscopy; he prefers it to be sometime in the month of December rather than November.  OK to d/c home from my standpoint. RN will contact Dr. Mirna Mires, Melton Alar, MD  03/24/2013, 1:25 PM [facility] Pager [phone]

## 2013-03-24 NOTE — Progress Notes (Addendum)
NURSING PROGRESS NOTE  Rickey Hutchinson [id number] Admission Data: 03/24/2013 2132 Attending Provider: [name], MD WUJ:[id number],WGNF S, MD Code Status: Full  Rickey Hutchinson is a 62 y.o. male patient admitted from ED:  -No acute distress noted.  -No complaints of shortness of breath.  -No complaints of chest pain.   Cardiac Monitoring: Box # [id number] in place. Cardiac monitor yields:normal sinus rhythm.  Blood pressure 123/59, pulse 73, temperature 98.1 F (36.7 C), temperature source Oral, resp. rate 21, height 5\' 10"  (1.778 m), weight 108.636 kg (239 lb 8 oz), SpO2 99.00%.   IV Fluids:  IV in place, occlusive dsg intact without redness, IV cath forearm left, condition patent and no redness NSL   Allergies:  Review of patient's allergies indicates no known allergies.  Past Medical History:   has a past medical history of COPD (chronic obstructive pulmonary disease); CHF (congestive heart failure); and Stroke.  Past Surgical History:   has past surgical history that includes stents and Appendectomy.  Social History:   reports that he has been smoking Cigarettes.  He has been smoking about 1.00 pack per day. He has never used smokeless tobacco. He reports that he does not drink alcohol or use illicit drugs.  Skin: NSI  Patient oriented to room. Information packet given to patient. Admission inpatient armband information verified with patient to include name and date of birth and placed on patient arm. Side rails up x 2, fall assessment and education completed with patient. Patient able to verbalize understanding of risk associated with falls and verbalized understanding to call for assistance before getting out of bed. Call light within reach. Patient able to voice and demonstrate understanding of unit orientation instructions.

## 2013-03-24 NOTE — Progress Notes (Signed)
Subjective:  No additional BM or GI bleed. Afebrile. LLQ pain as before. Hgb 15.9 from 17.3  Objective:  Vital Signs in the last 24 hours: Temp:  [97.3 F (36.3 C)-98.4 F (36.9 C)] 98.4 F (36.9 C) (11/22 0711) Pulse Rate:  [71-91] 85 (11/22 0722) Cardiac Rhythm:  [-] Normal sinus rhythm (11/22 0843) Resp:  [11-24] 16 (11/22 0711) BP: (97-137)/(59-94) 108/69 mmHg (11/22 0722) SpO2:  [96 %-100 %] 96 % (11/22 0711) Weight:  [106.958 kg (235 lb 12.8 oz)-111.721 kg (246 lb 4.8 oz)] 106.958 kg (235 lb 12.8 oz) (11/22 0500)  Physical Exam: BP Readings from Last 1 Encounters:  03/24/13 108/69     Wt Readings from Last 1 Encounters:  03/24/13 106.958 kg (235 lb 12.8 oz)    Weight change:   HEENT: [place]/AT, Eyes- PERL, EOMI, Conjunctiva-Pink, Sclera-Non-icteric Neck: No JVD, No bruit, Trachea midline. Lungs:  Clear, Bilateral. Cardiac:  Regular rhythm, normal S1 and S2, no S3.  Abdomen:  Soft, distended, LLQ-tender. Extremities:  No edema present. No cyanosis. No clubbing. CNS: AxOx3, Cranial nerves grossly intact, moves all 4 extremities. Right handed. Skin: Warm and dry.   Intake/Output from previous day:      Lab Results: BMET    Component Value Date/Time   NA 137 03/24/2013 0358   K 3.6 03/24/2013 0358   CL 100 03/24/2013 0358   CO2 26 03/24/2013 0358   GLUCOSE 81 03/24/2013 0358   BUN 10 03/24/2013 0358   CREATININE 1.06 03/24/2013 0358   CALCIUM 9.2 03/24/2013 0358   GFRNONAA 74* 03/24/2013 0358   GFRAA 86* 03/24/2013 0358   CBC    Component Value Date/Time   WBC 13.5* 03/24/2013 0358   RBC 4.86 03/24/2013 0358   HGB 15.9 03/24/2013 0358   HCT 45.6 03/24/2013 0358   PLT 254 03/24/2013 0358   MCV 93.8 03/24/2013 0358   MCH 32.7 03/24/2013 0358   MCHC 34.9 03/24/2013 0358   RDW 14.0 03/24/2013 0358   LYMPHSABS 4.5* 09/09/2009 0510   MONOABS 1.0 09/09/2009 0510   EOSABS 0.5 09/09/2009 0510   BASOSABS 0.1 09/09/2009 0510   CARDIAC ENZYMES Lab Results   Component Value Date   CKTOTAL 142 09/09/2009   CKMB 2.0 09/09/2009    Scheduled Meds: . carvedilol  20 mg Oral Daily  . docusate sodium  100 mg Oral BID  . ezetimibe  10 mg Oral Daily  . sodium chloride  3 mL Intravenous Q12H   Continuous Infusions:  PRN Meds:.acetaminophen, acetaminophen, alum & mag hydroxide-simeth, nitroGLYCERIN, ondansetron (ZOFRAN) IV, ondansetron  Assessment/Plan: Acute lower GI bleed  CAD  Hypertension  COPD  Obesity  GI consult.   LOS: 1 day    Orpah Cobb  MD  03/24/2013, 10:46 AM

## 2013-03-25 NOTE — Progress Notes (Signed)
Spoke with Dr. Jomarie Longs regarding IV site. MD says ok to leave for another 24 hours pending discharge.

## 2013-03-25 NOTE — Progress Notes (Signed)
[name] to be D/C'd Home per MD order.  Discussed with the patient and all questions fully answered.    Medication List         aspirin 81 MG tablet  Take 162 mg by mouth daily.     carvedilol 20 MG 24 hr capsule  Commonly known as:  COREG CR  Take 20 mg by mouth daily.     ezetimibe 10 MG tablet  Commonly known as:  ZETIA  Take 10 mg by mouth daily.     furosemide 40 MG tablet  Commonly known as:  LASIX  Take 40 mg by mouth.     nitroGLYCERIN 0.4 MG SL tablet  Commonly known as:  NITROSTAT  Place 0.4 mg under the tongue every 5 (five) minutes as needed for chest pain.     potassium chloride 10 MEQ tablet  Commonly known as:  K-DUR  Take 10 mEq by mouth daily.        VVS, Skin clean, dry and intact without evidence of skin break down, no evidence of skin tears noted. IV catheter discontinued intact. Site without signs and symptoms of complications. Dressing and pressure applied.  An After Visit Summary was printed and given to the patient. Patient escorted via WC, and D/C home via private auto.  [name] 03/25/2013 1:13 PM

## 2013-03-25 NOTE — Discharge Summary (Signed)
Physician Discharge Summary  Patient ID: Rickey Hutchinson MRN: [id number] DOB/AGE: 1951/02/01 62 y.o.  Admit date: 03/23/2013 Discharge date: 03/25/2013  Admission Diagnoses: Acute lower GI bleed  CAD  Hypertension  COPD  Obesity  Discharge Diagnoses:  Principle Problem: * Acute lower GI bleed * Possible hemorrhoidal bleed CAD  Hypertension  COPD  Obesity   Discharged Condition: fair  Hospital Course: 62 year old male with history of stroke and CAD reported loose stools since Thursday night after eating spicy spaghetti. He had bright red blood per rectum x 3 during the Friday AM. With mild lower abdominal pain, no fever and no vomiting. He had GI consult who recommended OP work up. He had CT of abdomen and pelvis which showed diverticulosis without acute finding. He was monitoered for 24-48 hours and discharged home in stable condition.  Consults: GI  Significant Diagnostic Studies: labs: Normal CMET and near normal CBC except slightly high WBC count.  CT abdomen and Pelvis: Diverticulosis of colon without diverticulitis. No nephrolithiasis or hydroureteronephrosis bilaterally.  Treatments: IV hydration and cardiac meds: carvedilol and low dose aspirin.  Discharge Exam: Blood pressure 122/75, pulse 76, temperature 97.6 F (36.4 C), temperature source Oral, resp. rate 18, height 5\' 10"  (1.778 m), weight 106.686 kg (235 lb 3.2 oz), SpO2 96.00%. HEENT: [place]/AT, Eyes- PERL, EOMI, Conjunctiva-Pink, Sclera-Non-icteric  Neck: No JVD, No bruit, Trachea midline.  Lungs: Clear, Bilateral.  Cardiac: Regular rhythm, normal S1 and S2, no S3.  Abdomen: Soft, distended, LLQ-tender.  Extremities: No edema present. No cyanosis. No clubbing.  CNS: AxOx3, Cranial nerves grossly intact, moves all 4 extremities. Right handed.  Skin: Warm and dry.   Disposition: 01-Home or Self Care     Medication List         aspirin 81 MG tablet  Take 162 mg by mouth daily.     carvedilol 20 MG  24 hr capsule  Commonly known as:  COREG CR  Take 20 mg by mouth daily.     ezetimibe 10 MG tablet  Commonly known as:  ZETIA  Take 10 mg by mouth daily.     furosemide 40 MG tablet  Commonly known as:  LASIX  Take 40 mg by mouth.     nitroGLYCERIN 0.4 MG SL tablet  Commonly known as:  NITROSTAT  Place 0.4 mg under the tongue every 5 (five) minutes as needed for chest pain.     potassium chloride 10 MEQ tablet  Commonly known as:  K-DUR  Take 10 mEq by mouth daily.           Follow-up Information   Follow up with [facility] S, MD. Schedule an appointment as soon as possible for a visit in 1 month.   Specialty:  Cardiology   Contact information:   [address] [place] [address] [phone]       Follow up with Rachael Fee, MD. Schedule an appointment as soon as possible for a visit in 3 weeks.   Specialty:  Gastroenterology   Contact information:   [address] [place] [address] [phone]       Signed: [name] 03/25/2013, 10:20 AM

## 2013-03-26 ENCOUNTER — Telehealth: Payer: Self-pay

## 2013-03-26 NOTE — Telephone Encounter (Signed)
Message copied by Donata Duff on Mon Mar 26, 2013  8:17 AM ------      Message from: Rachael Fee      Created: Sat Mar 24, 2013  1:39 PM       Rickey Hutchinson,      He is going home from [facility] today.  Can you get in touch with him for [facility] colonoscoyp (Moderate sedation) for minor rectal bleeding. He prefers sometime in month of December, thanks       ------

## 2013-03-26 NOTE — Telephone Encounter (Signed)
Pt has been scheduled for 04/16/13 colon and previsit

## 2013-03-28 ENCOUNTER — Emergency Department (HOSPITAL_COMMUNITY)
Admission: EM | Admit: 2013-03-28 | Discharge: 2013-03-28 | Disposition: A | Discharge status: Home / Self Care | Payer: Medicare Other | Attending: Emergency Medicine | Admitting: Emergency Medicine

## 2013-03-28 ENCOUNTER — Encounter (HOSPITAL_COMMUNITY): Payer: Self-pay | Admitting: Emergency Medicine

## 2013-03-28 DIAGNOSIS — F17200 Nicotine dependence, unspecified, uncomplicated: Secondary | ICD-10-CM | POA: Insufficient documentation

## 2013-03-28 DIAGNOSIS — R1032 Left lower quadrant pain: Secondary | ICD-10-CM | POA: Insufficient documentation

## 2013-03-28 DIAGNOSIS — J449 Chronic obstructive pulmonary disease, unspecified: Secondary | ICD-10-CM | POA: Insufficient documentation

## 2013-03-28 DIAGNOSIS — K625 Hemorrhage of anus and rectum: Secondary | ICD-10-CM

## 2013-03-28 DIAGNOSIS — Z79899 Other long term (current) drug therapy: Secondary | ICD-10-CM | POA: Insufficient documentation

## 2013-03-28 DIAGNOSIS — R109 Unspecified abdominal pain: Secondary | ICD-10-CM

## 2013-03-28 DIAGNOSIS — Z8673 Personal history of transient ischemic attack (TIA), and cerebral infarction without residual deficits: Secondary | ICD-10-CM | POA: Insufficient documentation

## 2013-03-28 DIAGNOSIS — J4489 Other specified chronic obstructive pulmonary disease: Secondary | ICD-10-CM | POA: Insufficient documentation

## 2013-03-28 DIAGNOSIS — Z7982 Long term (current) use of aspirin: Secondary | ICD-10-CM | POA: Insufficient documentation

## 2013-03-28 DIAGNOSIS — K644 Residual hemorrhoidal skin tags: Secondary | ICD-10-CM | POA: Insufficient documentation

## 2013-03-28 DIAGNOSIS — I2510 Atherosclerotic heart disease of native coronary artery without angina pectoris: Secondary | ICD-10-CM | POA: Insufficient documentation

## 2013-03-28 DIAGNOSIS — Z9861 Coronary angioplasty status: Secondary | ICD-10-CM | POA: Insufficient documentation

## 2013-03-28 DIAGNOSIS — I509 Heart failure, unspecified: Secondary | ICD-10-CM | POA: Insufficient documentation

## 2013-03-28 HISTORY — DX: Atherosclerotic heart disease of native coronary artery without angina pectoris: I25.10

## 2013-03-28 HISTORY — DX: Presence of coronary angioplasty implant and graft: Z95.5

## 2013-03-28 LAB — TYPE AND SCREEN
ABO/RH(D): A POS
Antibody Screen: NEGATIVE

## 2013-03-28 LAB — BASIC METABOLIC PANEL
CO2: 24 mEq/L (ref 19–32)
Chloride: 99 mEq/L (ref 96–112)
Creatinine, Ser: 0.88 mg/dL (ref 0.50–1.35)
GFR calc Af Amer: >90 mL/min (ref >90)
Glucose, Bld: 105 mg/dL — ABNORMAL HIGH (ref 70–99)
Potassium: 3.7 mEq/L (ref 3.5–5.1)
Sodium: 136 mEq/L (ref 135–145)

## 2013-03-28 LAB — PROTIME-INR
INR: 0.93 (ref 0.00–1.49)
Prothrombin Time: 12.3 seconds (ref 11.6–15.2)

## 2013-03-28 LAB — CBC
MCV: 93.5 fL (ref 78.0–100.0)
Platelets: 251 10*3/uL (ref 150–400)
RBC: 4.96 MIL/uL (ref 4.22–5.81)
RDW: 13.8 % (ref 11.5–15.5)
WBC: 13.3 10*3/uL — ABNORMAL HIGH (ref 4.0–10.5)

## 2013-03-28 MED ORDER — IBUPROFEN 600 MG PO TABS: 600.0000 mg | ORAL_TABLET | Freq: Four times a day (QID) | ORAL | Status: DC | PRN

## 2013-03-28 MED ORDER — OXYCODONE-ACETAMINOPHEN 5-325 MG PO TABS: 1.0000 | ORAL_TABLET | ORAL | Status: DC | PRN

## 2013-03-28 MED ORDER — METRONIDAZOLE 500 MG PO TABS
500.0000 mg | ORAL_TABLET | Freq: Once | ORAL | Status: AC
  Administered 2013-03-28: 500 mg via ORAL
  Filled 2013-03-28: qty 1

## 2013-03-28 MED ORDER — SODIUM CHLORIDE 0.9 % IV BOLUS (SEPSIS)
1000.0000 mL | Freq: Once | INTRAVENOUS | Status: AC
  Administered 2013-03-28: 1000 mL via INTRAVENOUS

## 2013-03-28 MED ORDER — METRONIDAZOLE 500 MG PO TABS: 500.0000 mg | ORAL_TABLET | Freq: Two times a day (BID) | ORAL | Status: DC

## 2013-03-28 MED ORDER — HYDROMORPHONE HCL PF 1 MG/ML IJ SOLN
1.0000 mg | Freq: Once | INTRAMUSCULAR | Status: AC
  Administered 2013-03-28: 1 mg via INTRAVENOUS
  Filled 2013-03-28: qty 1

## 2013-03-28 NOTE — ED Notes (Signed)
MD at bedside. 

## 2013-03-28 NOTE — ED Notes (Signed)
Pt not wanting to be discharged. States he needs to be admitted. Dr. Patria Mane made aware and explained to patient his reason for discharge. Pt still insisting on being admitted.

## 2013-03-28 NOTE — ED Provider Notes (Signed)
CSN: [id number]     Arrival date & time 03/28/13  1324 History   First MD Initiated Contact with Patient 03/28/13 0745     Chief Complaint  Patient presents with  . Rectal Bleeding   (Consider location/radiation/quality/duration/timing/severity/associated sxs/prior Treatment) The history is provided by the patient and medical records.   patient reports ongoing occasional blood covered stool over the past several days.  He was admitted the hospital 6 days ago and found to have bleeding external hemorrhoids.  Colonoscopy was recommended and this is scheduled for December 15.  He denies weakness.  He reports ongoing left-sided abdominal pain over the past 6 days.  He did have a CT scan performed which demonstrated diverticulosis without diverticulitis.  He denies fevers and chills.  He denies nausea and vomiting.  He states every time he has a bowel movement "it burns".   Past Medical History  Diagnosis Date  . COPD (chronic obstructive pulmonary disease)   . CHF (congestive heart failure)   . Stroke   . Coronary artery disease   . Stented coronary artery     x3   Past Surgical History  Procedure Laterality Date  . Stents      3 stents in heart since 2005  . Appendectomy     Family History  Problem Relation Age of Onset  . Cancer Mother   . Congestive Heart Failure Father    History  Substance Use Topics  . Smoking status: Current Every Day Smoker -- 1.00 packs/day    Types: Cigarettes  . Smokeless tobacco: Never Used  . Alcohol Use: No    Review of Systems  Gastrointestinal: Positive for hematochezia.  All other systems reviewed and are negative.    Allergies  Review of patient's allergies indicates no known allergies.  Home Medications   Current Outpatient Rx  Name  Route  Sig  Dispense  Refill  . aspirin 81 MG tablet   Oral   Take 162 mg by mouth daily.          . carvedilol (COREG CR) 20 MG 24 hr capsule   Oral   Take 20 mg by mouth daily.         Marland Kitchen  ezetimibe (ZETIA) 10 MG tablet   Oral   Take 10 mg by mouth daily.         . furosemide (LASIX) 40 MG tablet   Oral   Take 40 mg by mouth daily.          . nitroGLYCERIN (NITROSTAT) 0.4 MG SL tablet   Sublingual   Place 0.4 mg under the tongue every 5 (five) minutes as needed for chest pain.         . potassium chloride (K-DUR) 10 MEQ tablet   Oral   Take 10 mEq by mouth daily.          Marland Kitchen ibuprofen (ADVIL,MOTRIN) 600 MG tablet   Oral   Take 1 tablet (600 mg total) by mouth every 6 (six) hours as needed.   30 tablet   0   . metroNIDAZOLE (FLAGYL) 500 MG tablet   Oral   Take 1 tablet (500 mg total) by mouth 2 (two) times daily.   14 tablet   0   . oxyCODONE-acetaminophen (PERCOCET/ROXICET) 5-325 MG per tablet   Oral   Take 1 tablet by mouth every 4 (four) hours as needed for severe pain.   20 tablet   0    BP 99/65  Pulse 72  Temp(Src) 99 F (37.2 C) (Oral)  Resp 20  SpO2 96% Physical Exam  Nursing note and vitals reviewed. Constitutional: He is oriented to person, place, and time. He appears well-developed and well-nourished.  HENT:  Head: Normocephalic and atraumatic.  Eyes: EOM are normal.  Neck: Normal range of motion.  Cardiovascular: Normal rate, regular rhythm, normal heart sounds and intact distal pulses.   Pulmonary/Chest: Effort normal and breath sounds normal. No respiratory distress.  Abdominal: Soft. He exhibits no distension.  Left lower quadrant tenderness without guarding or rebound  Genitourinary:  Multiple nonbleeding external hemorrhoids.  Mild rectal tenderness on examination.  Round stool.  No gross blood.  Hemoccult positive stool  Musculoskeletal: Normal range of motion.  Neurological: He is alert and oriented to person, place, and time.  Skin: Skin is warm and dry.  Psychiatric: He has a normal mood and affect. Judgment normal.    ED Course  Procedures (including critical care time) Labs Review Labs Reviewed  CBC -  Abnormal; Notable for the following:    WBC 13.3 (*)    All other components within normal limits  BASIC METABOLIC PANEL - Abnormal; Notable for the following:    Glucose, Bld 105 (*)    All other components within normal limits  PROTIME-INR  TYPE AND SCREEN   Imaging Review No results found.  EKG Interpretation   None       MDM   1. Abdominal pain   2. Rectal bleeding   3. External hemorrhoid    Likely ongoing bleeding from the hemorrhoids.  I suspect he has some degree of bleeding internal hemorrhoids.  Given his ongoing left lower quadrant tenderness and a history of diverticulosis the patient will be treated as suspected diverticulitis.  It may be that his CT scan was performed early in the course of diverticulitis and therefore did not demonstrate inflammatory changes.  My suspicion for diverticular abscess is very low.  Patient be placed on Flagyl and instructed to followup with his primary care physician and his GI specialist.  His hemoglobin remained stable at 16.  His stool is brown    Lyanne Co, MD 03/28/13 [phone]

## 2013-03-28 NOTE — ED Notes (Signed)
Pt reports dark red stools since last night at 0730pm. States seen here Friday for the same. Pt reports LLQ pain with hx of diverticulitis. Pt awake, alert, NAD at present.

## 2013-04-09 ENCOUNTER — Ambulatory Visit (AMBULATORY_SURGERY_CENTER): Payer: Self-pay | Admitting: *Deleted

## 2013-04-09 ENCOUNTER — Encounter: Payer: Self-pay | Admitting: Gastroenterology

## 2013-04-09 ENCOUNTER — Telehealth: Payer: Self-pay | Admitting: *Deleted

## 2013-04-09 VITALS — Ht 70.0 in | Wt 240.8 lb

## 2013-04-09 DIAGNOSIS — K625 Hemorrhage of anus and rectum: Secondary | ICD-10-CM

## 2013-04-09 MED ORDER — MOVIPREP 100 G PO SOLR: ORAL | Status: DC

## 2013-04-09 NOTE — Telephone Encounter (Signed)
Called patient, no answer. Left message for patient to call us back to make office visit before colonoscopy.

## 2013-04-09 NOTE — Telephone Encounter (Signed)
Patient here for pre-visit, was scheduled for colonoscopy for rectal bleeding. Patient refusing colonoscopy to be done here in [facility], even after explaining our routine and safety. Patient also having difficulty getting a ride, he did call his sister while in my office and she said she would bring him. Patient wanted colonoscopy for 04-16-13 cancelled at this time. Can patient have colonoscopy done at hospital? Or [facility]? Please advise,thanks

## 2013-04-09 NOTE — Telephone Encounter (Signed)
PATIENT CALLED BACK, O.V. 05-15-13 AT 1:30 PM.

## 2013-04-09 NOTE — Progress Notes (Signed)
Patient denies any allergies to eggs or soy. Patient denies any problems with anesthesia.  

## 2013-04-09 NOTE — Progress Notes (Signed)
Sent note to Dr.Jacobs explaining that patient refusing [facility], he wants colonoscopy done at hospital. See note. Cancelled colonoscopy for [facility] per patient's request. Also called and left patient message to call us back to make appointment for office visit per Dr.Jacobs request.

## 2013-04-09 NOTE — Telephone Encounter (Signed)
Sounds like he has some real concerns about the procedure.  I'd like to meet with him in office to discuss his concerns before scheduling the procedure.

## 2013-04-16 ENCOUNTER — Other Ambulatory Visit: Payer: Medicare Other | Admitting: Gastroenterology

## 2013-05-15 ENCOUNTER — Ambulatory Visit (INDEPENDENT_AMBULATORY_CARE_PROVIDER_SITE_OTHER): Payer: Medicare Other | Admitting: Gastroenterology

## 2013-05-15 ENCOUNTER — Encounter: Payer: Self-pay | Admitting: Gastroenterology

## 2013-05-15 VITALS — BP 110/76 | HR 76 | Ht 67.5 in | Wt 241.2 lb

## 2013-05-15 DIAGNOSIS — K625 Hemorrhage of anus and rectum: Secondary | ICD-10-CM
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
## 2013-05-15 NOTE — Patient Instructions (Addendum)
One of your biggest health concerns is your smoking.  This increases your risk for most cancers and serious cardiovascular diseases such as strokes, heart attacks.  You should try your best to stop.  If you need assistance, please contact your PCP or Sm[facility] Cessation Class at [facility] [phone]) or [facility] (1-800-QUIT-NOW). Dr. Christella HartiganJacobs has recommended you have a colonoscopy for your rectal bleeding to check for colon cancer but you have declined. If you change your mind about having the procedure then please call so that we can schedule it.

## 2013-06-27 IMAGING — CR DG KNEE 1-2V*R*
2 series · 2 of 2 positions shown · non-contrast
Comparison: None.

CLINICAL DATA: Lateral right knee pain with clicking.

RIGHT KNEE - 1-2 VIEW

[t knee ap right]
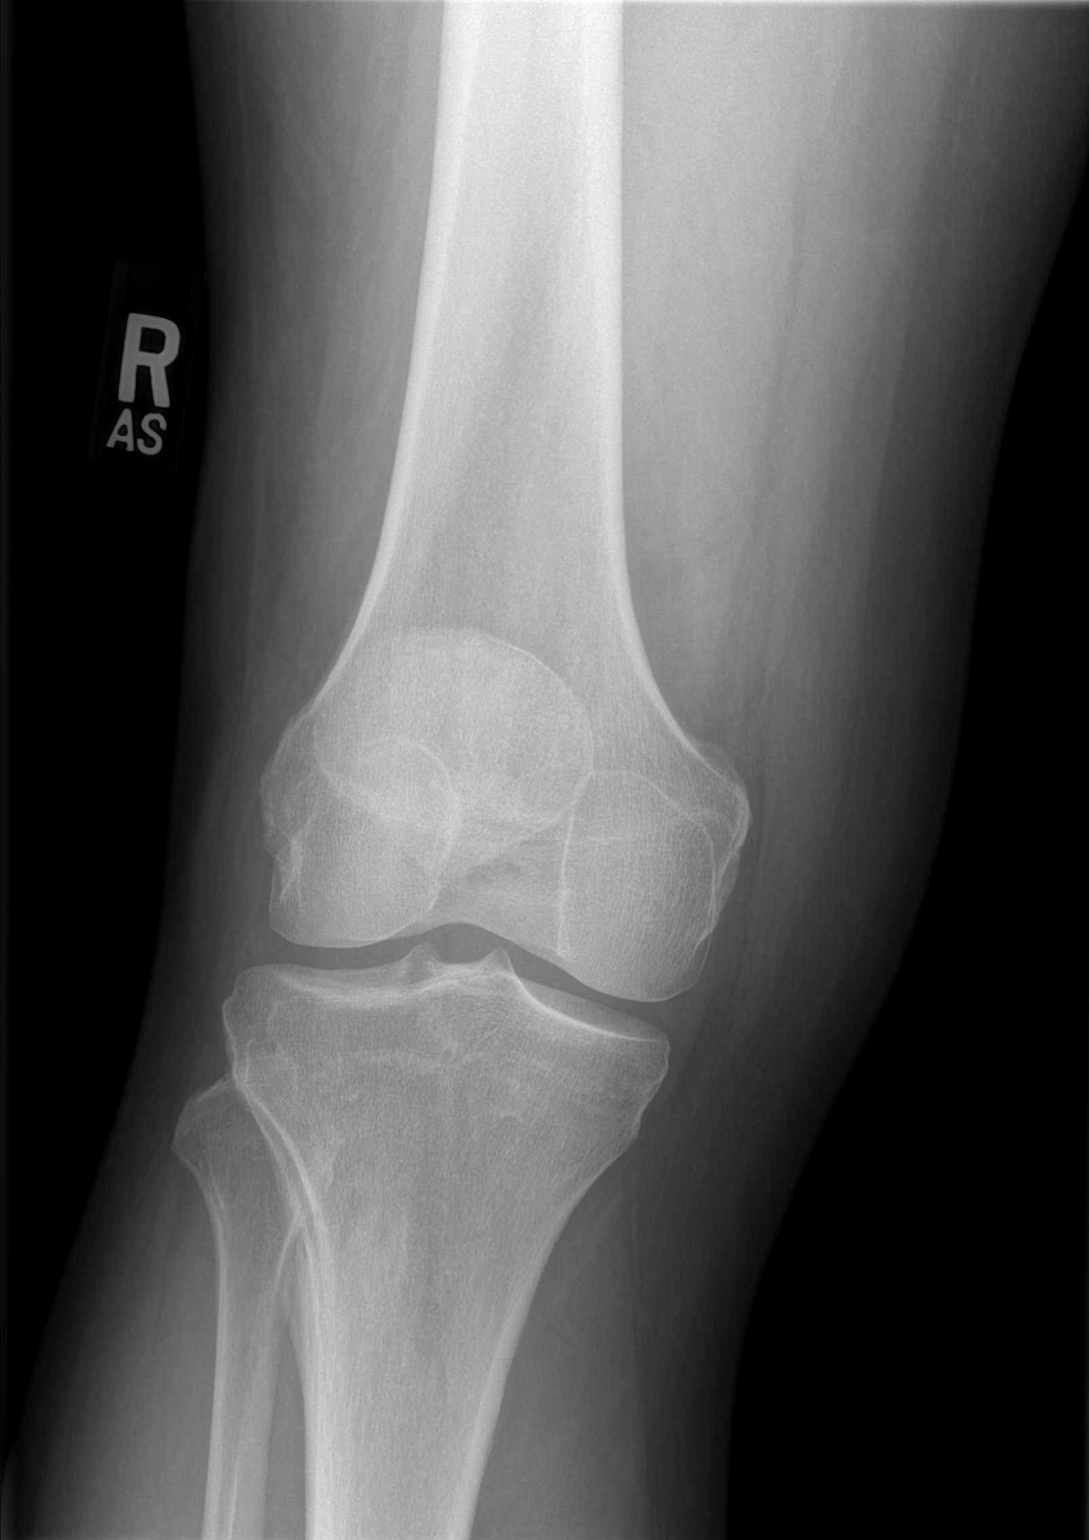

[t knee lat right]
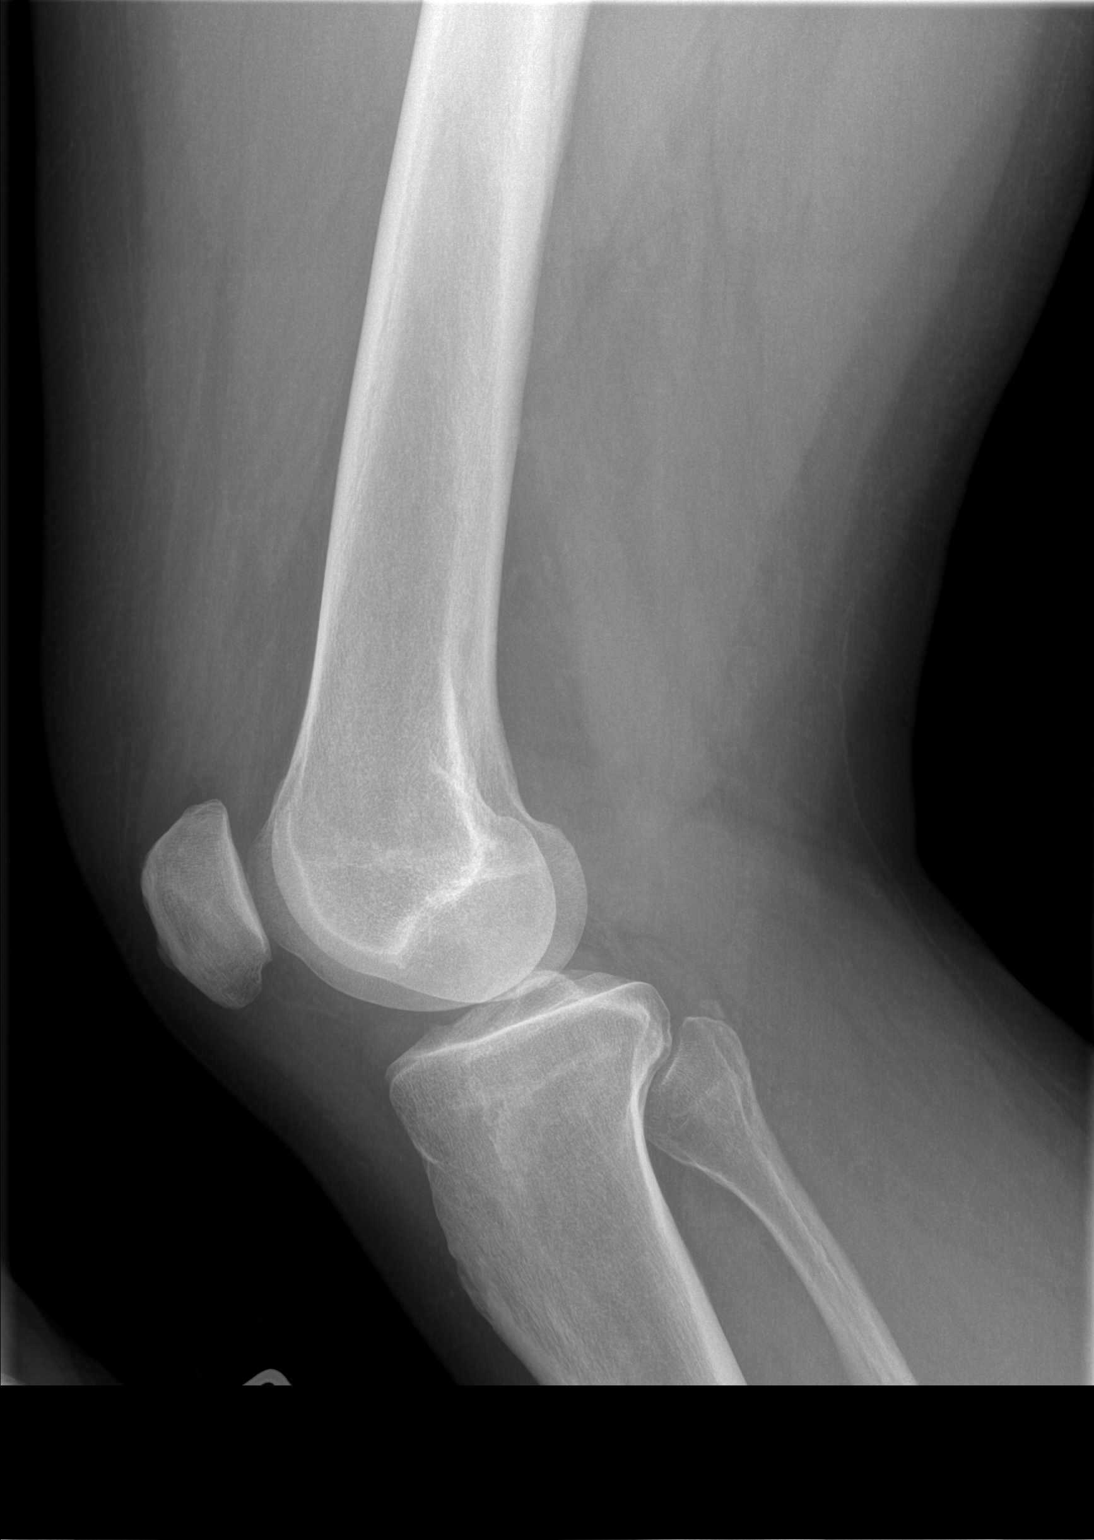

[2 of 2 positions shown; findings below may reference images not displayed]

FINDINGS: No joint effusion or fracture.  No significant
degenerative changes.
IMPRESSION: Negative.

## 2013-08-31 IMAGING — CR DG ABDOMEN 1V
3 series · 3 of 3 positions shown · non-contrast
Comparison: None.

CLINICAL DATA: Left lower quadrant pain with chills

ABDOMEN - 1 VIEW

[t abdomen supine (1 of 3)]
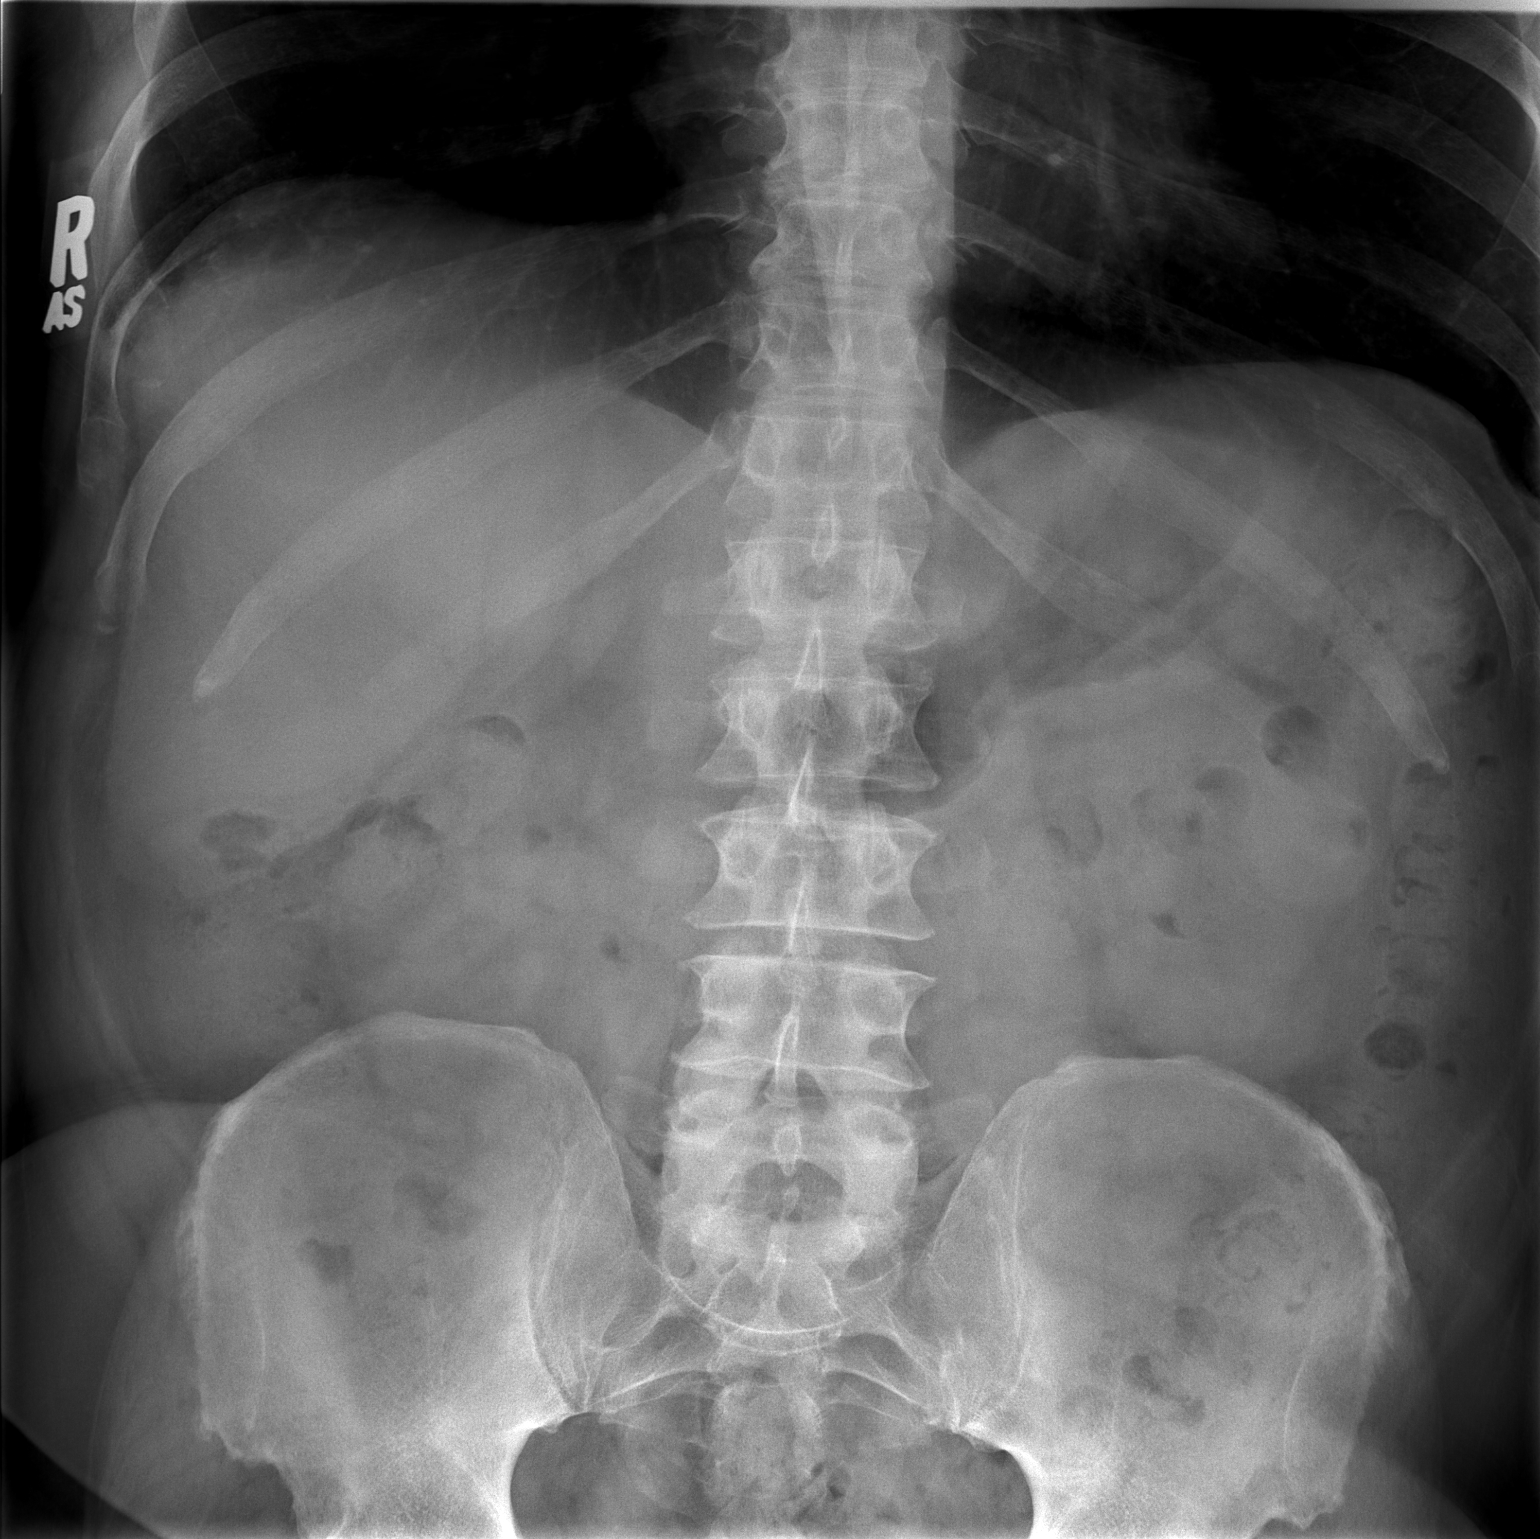

[t abdomen supine (2 of 3)]
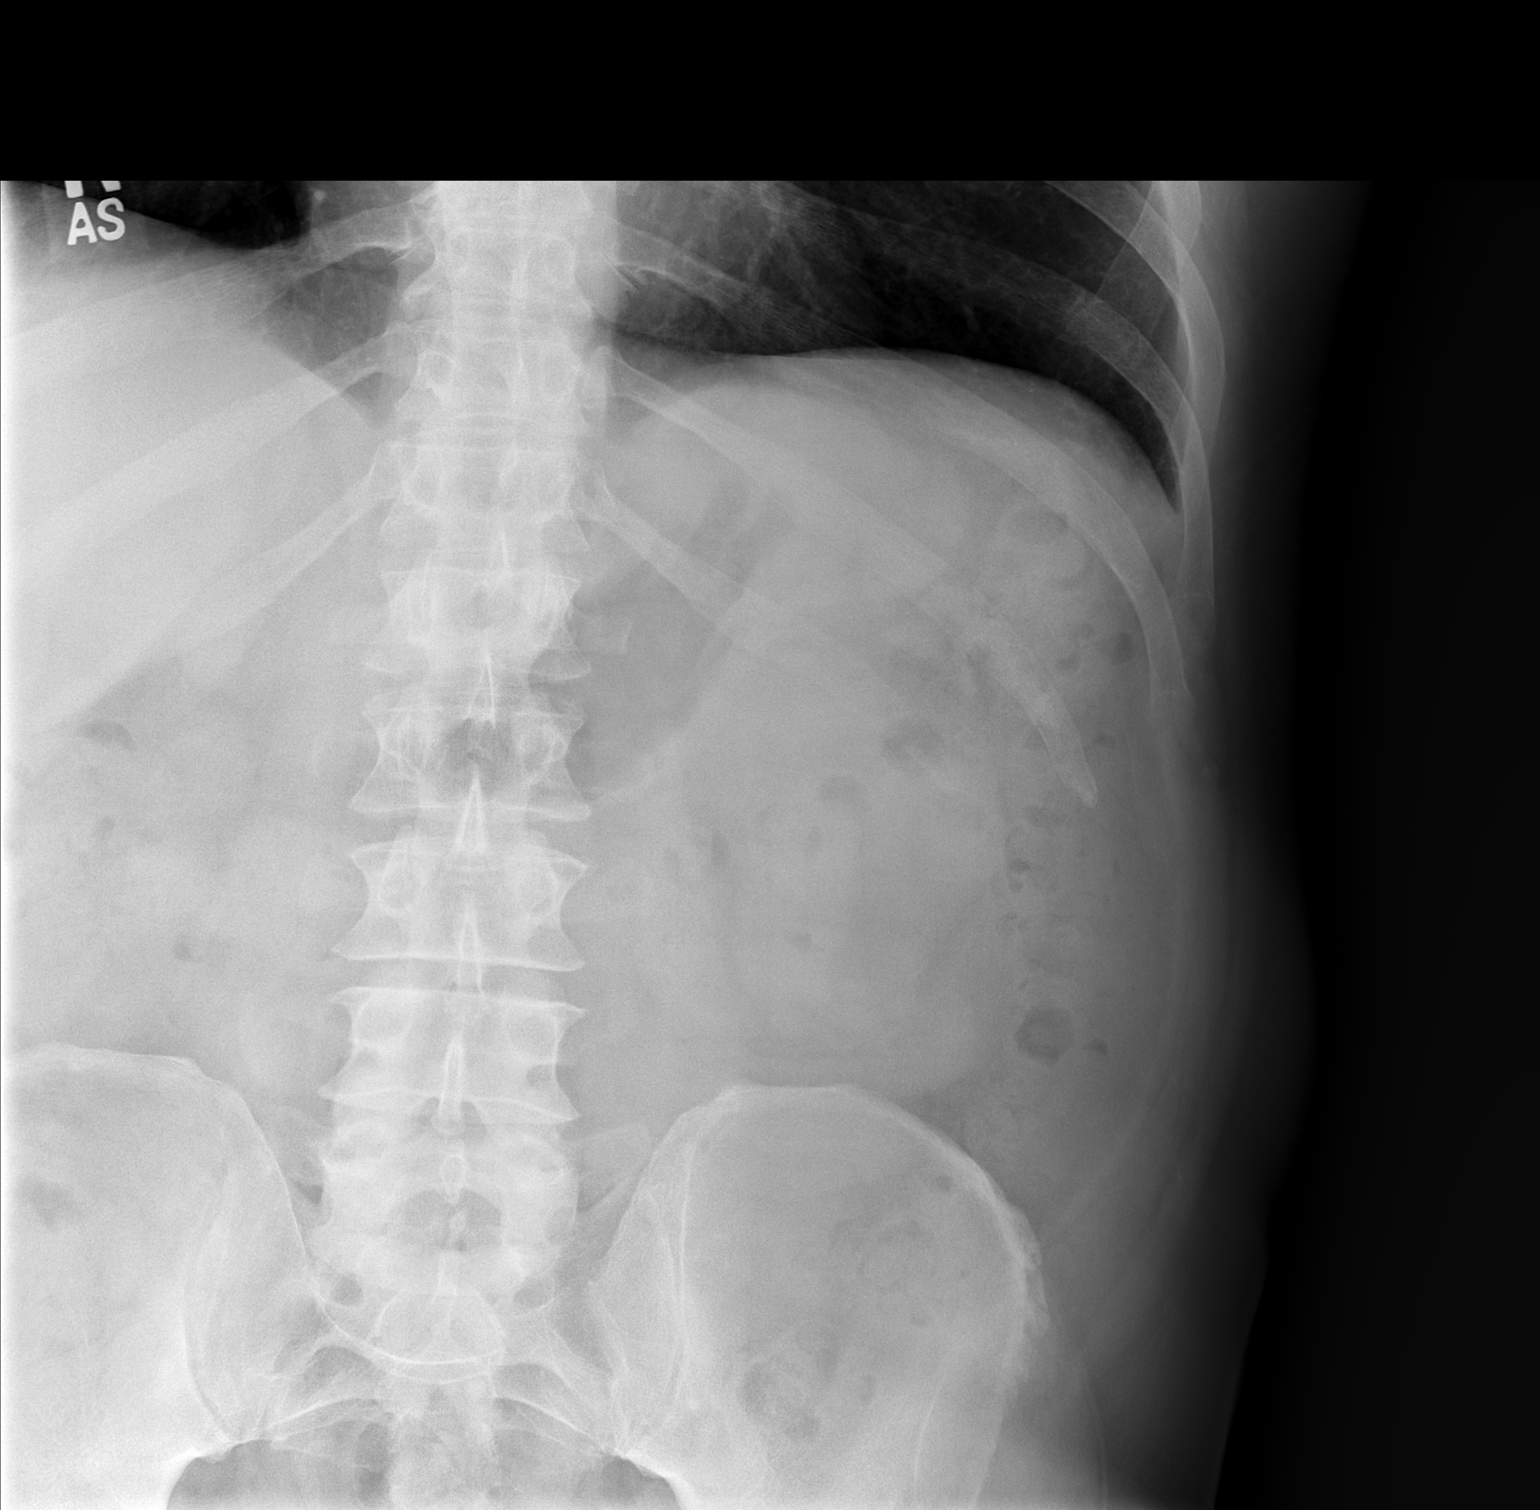

[t abdomen supine (3 of 3)]
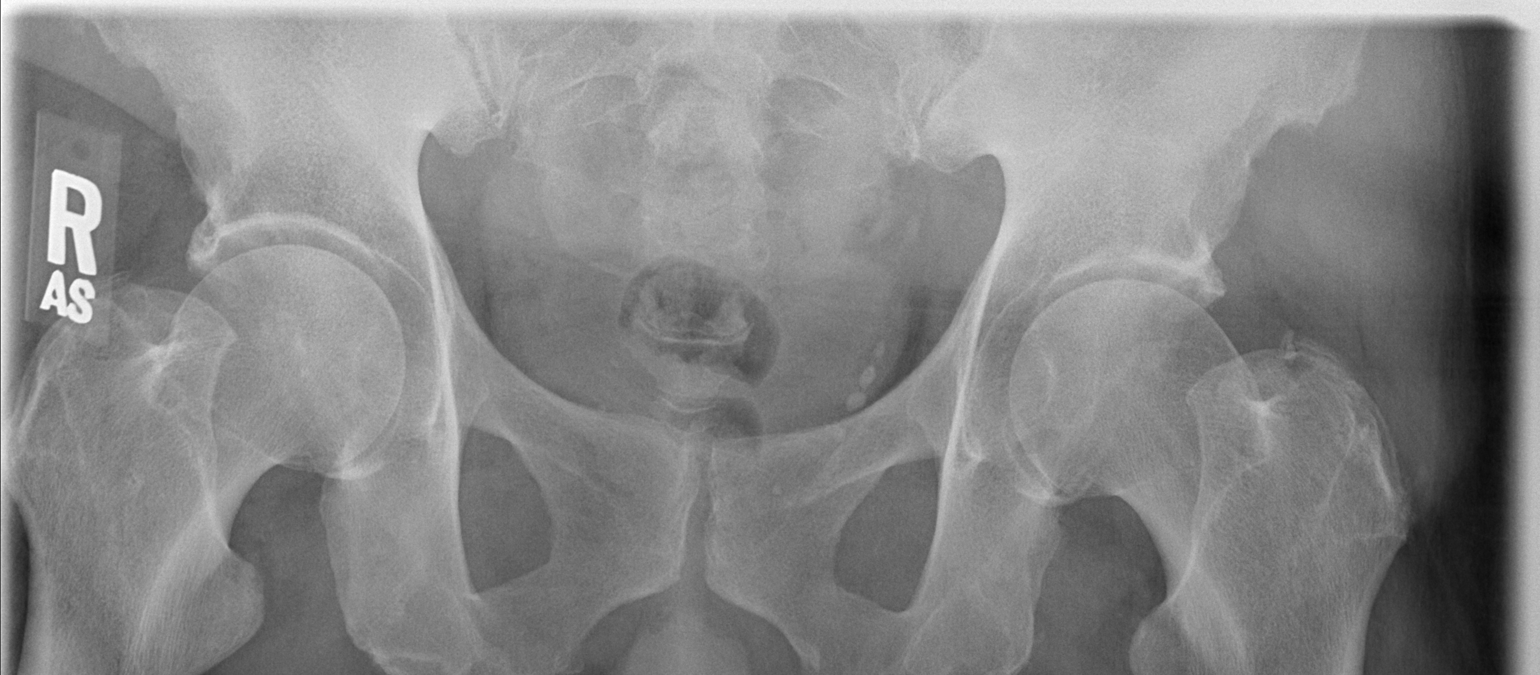

[3 of 3 positions shown; findings below may reference images not displayed]

FINDINGS: Supine views of the abdomen show a moderate amount of
feces throughout the entire colon.  No bowel obstruction is seen.
No opaque calculi are noted.
IMPRESSION: Moderate amount of feces throughout the colon.  No evidence of
bowel obstruction.

## 2014-01-09 IMAGING — CR DG CHEST 2V
2 series · 2 of 2 positions shown · non-contrast
Comparison: None.

CLINICAL DATA: Shortness of breath

CHEST - 2 VIEW

[w chest pa]
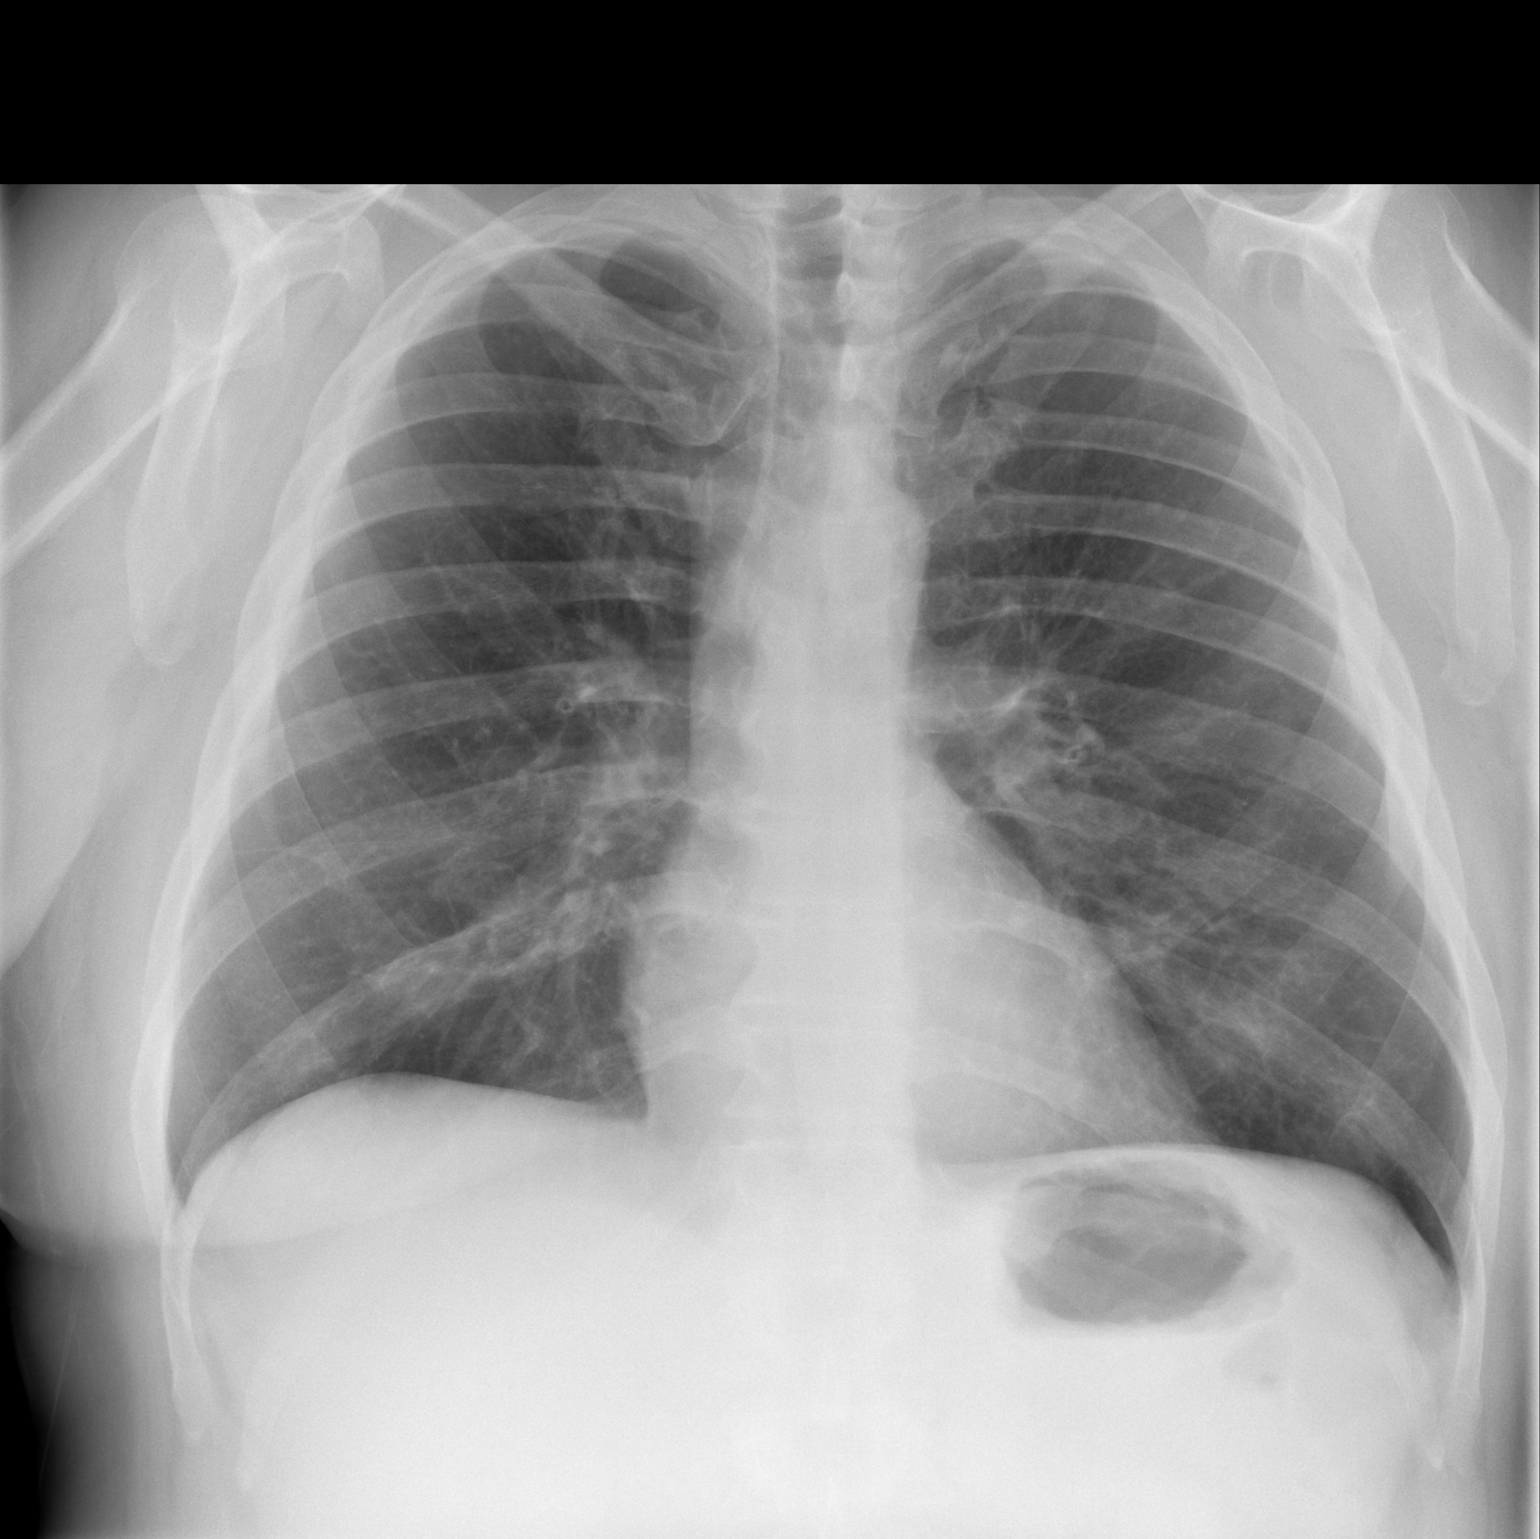

[w chest lat]
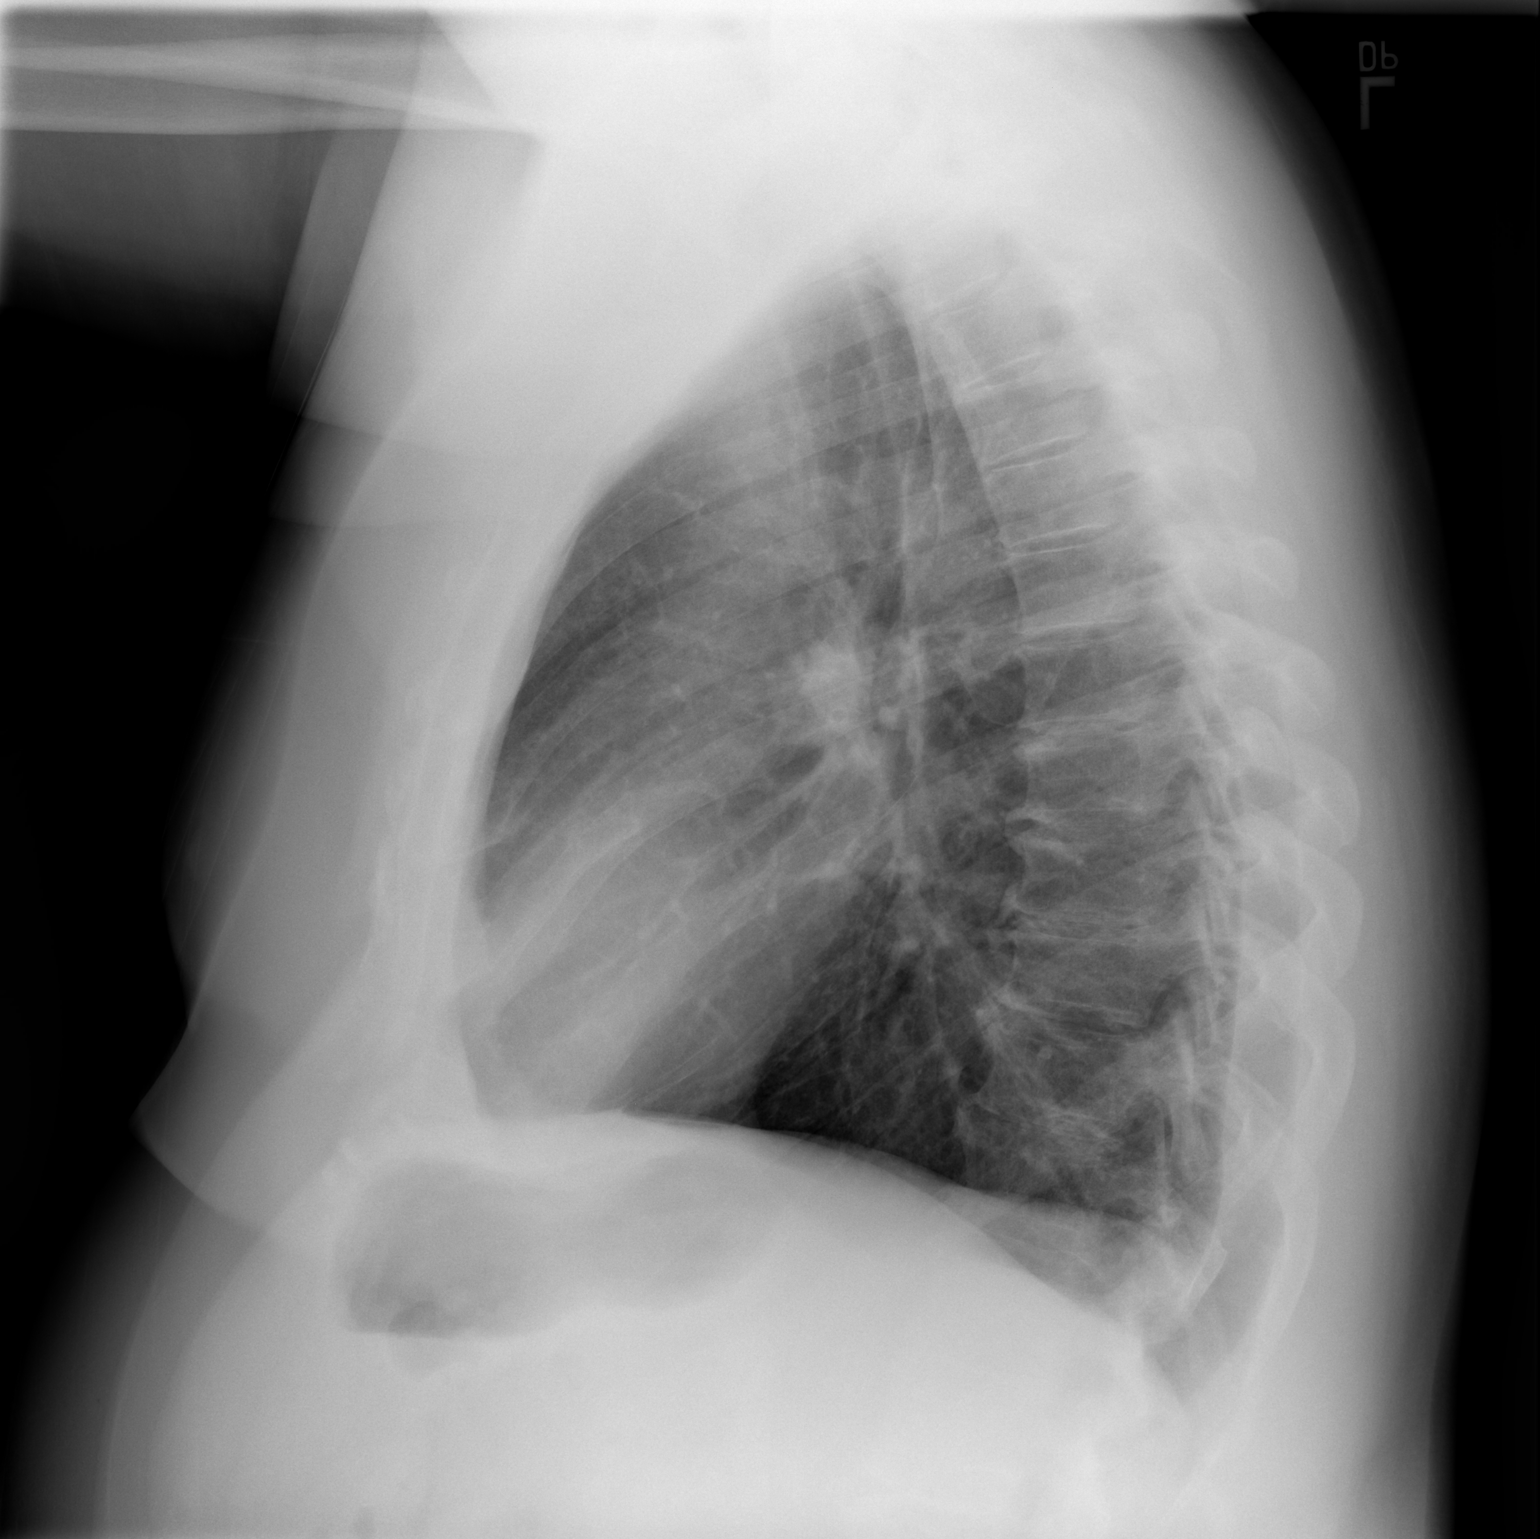

[2 of 2 positions shown; findings below may reference images not displayed]

FINDINGS: Cardiomediastinal silhouette appears normal.  No acute
pulmonary disease is noted.  Bony thorax is intact.
IMPRESSION: No acute cardiopulmonary abnormality seen.

## 2014-05-11 IMAGING — CT CT ABD-PELV W/O CM
2 of 4 series · 17 of 46 positions shown, 19 images · non-contrast
Comparison: None.

CLINICAL DATA: Left-sided abdomen pain. Rectal bleeding. Patient
has hemorrhoids.

EXAM:
CT ABDOMEN AND PELVIS WITHOUT CONTRAST
TECHNIQUE: Multidetector CT imaging of the abdomen and pelvis was performed
following the standard protocol without intravenous contrast. Oral
contrast administered.

[Series 2: routine · axial · 0.87mm/px · z∈[-509,-79]mm · 14 of 96 slices shown, 16 images]
[im 5/96  soft-tissue]
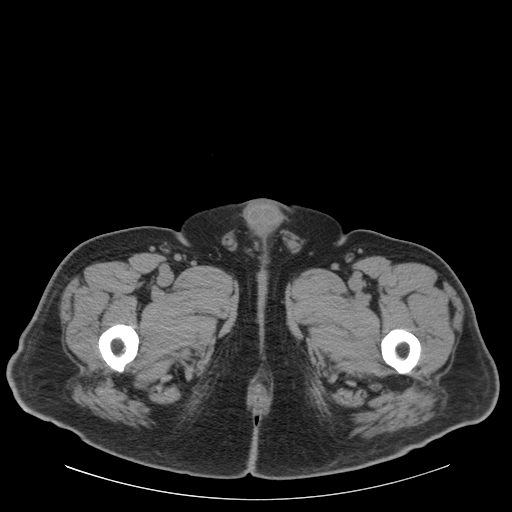
[im 5/96  bone]
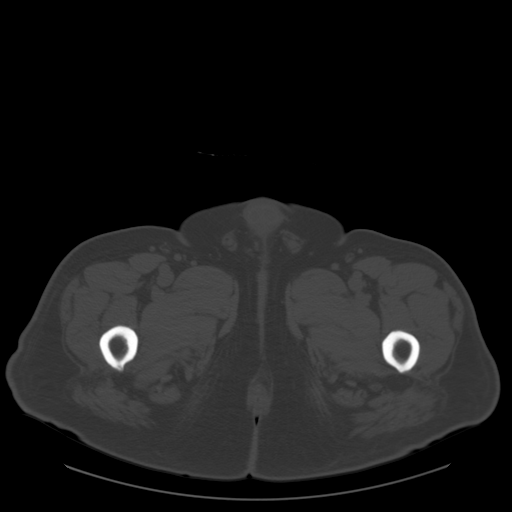
[im 13/96  soft-tissue]
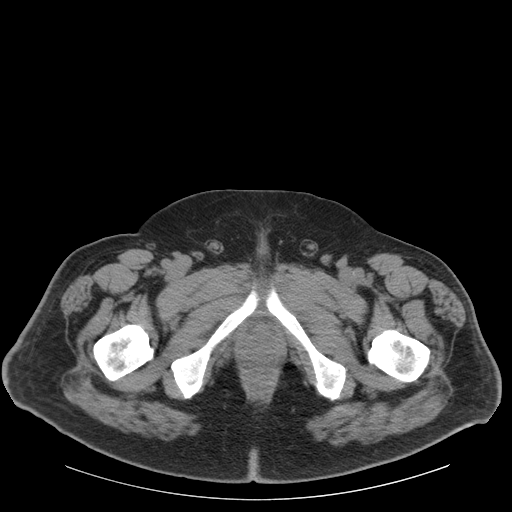
[im 17/96  soft-tissue]
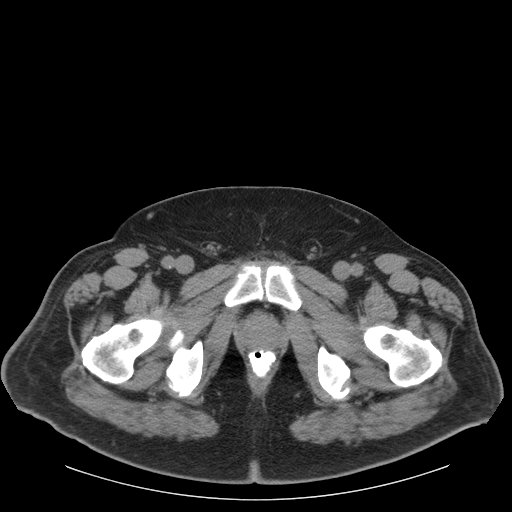
[im 25/96  soft-tissue]
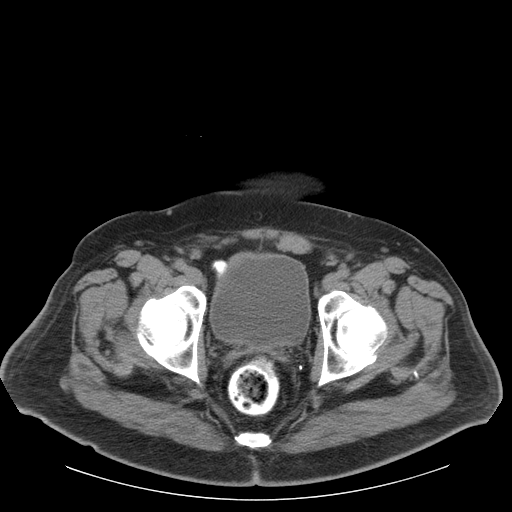
[im 34/96  soft-tissue]
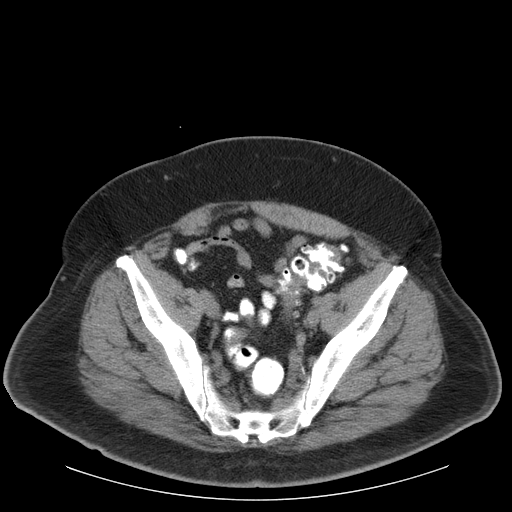
[im 38/96  soft-tissue]
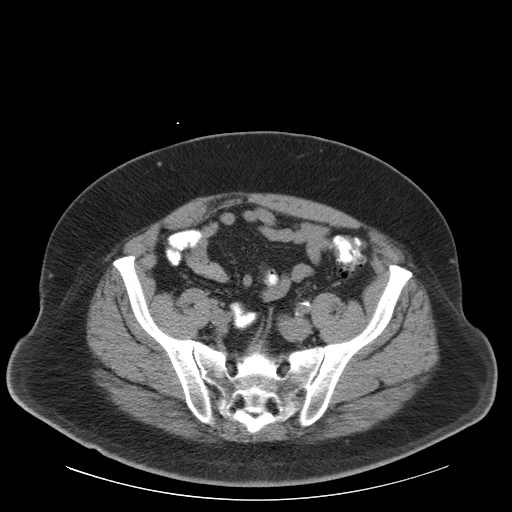
[im 46/96  soft-tissue]
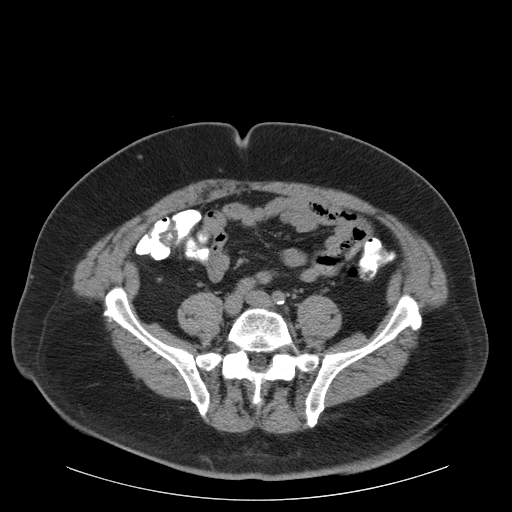
[im 50/96  soft-tissue]
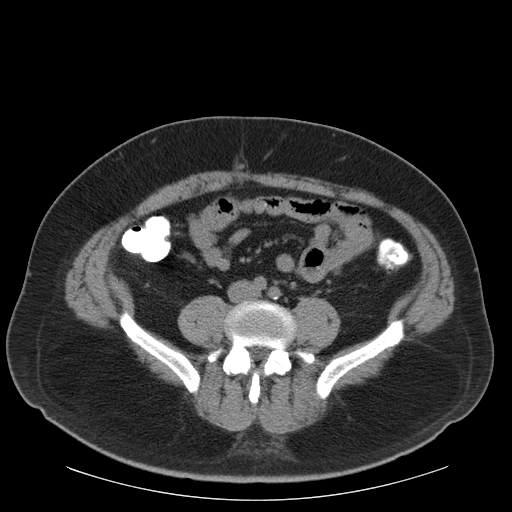
[im 58/96  soft-tissue]
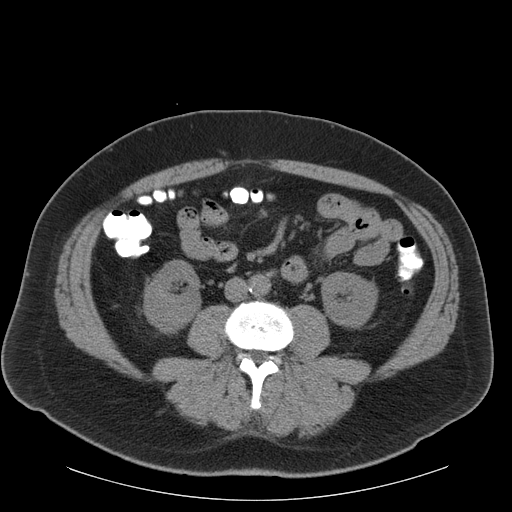
[im 58/96  bone]
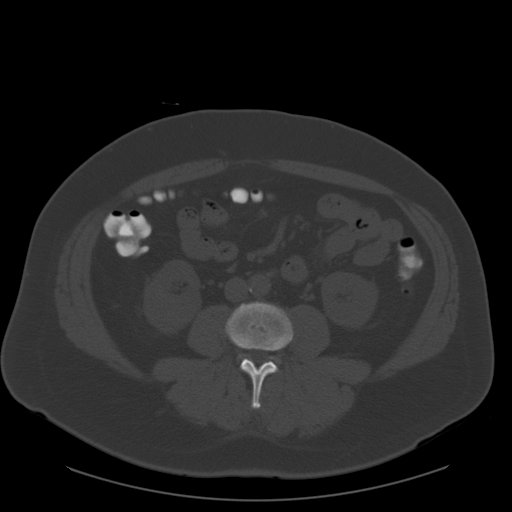
[im 62/96  soft-tissue]
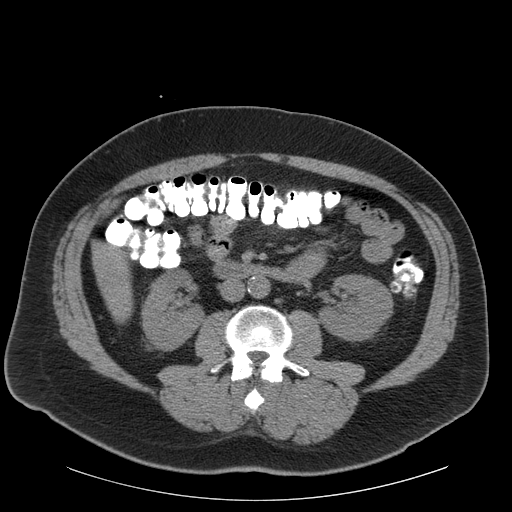
[im 71/96  soft-tissue]
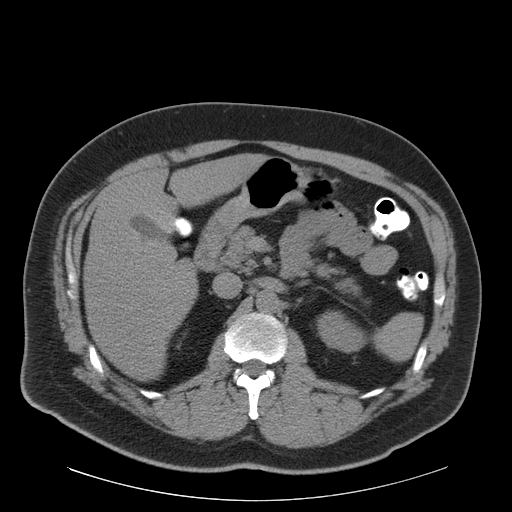
[im 79/96  soft-tissue]
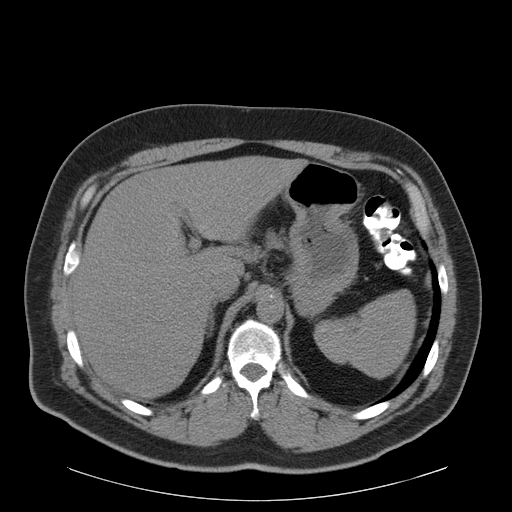
[im 83/96  soft-tissue]
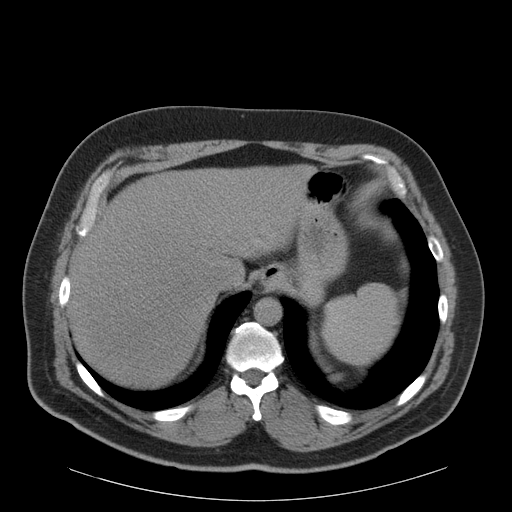
[im 91/96  soft-tissue]
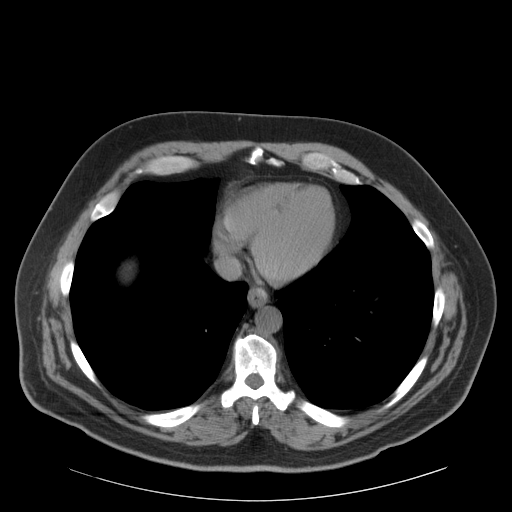

[mpr, coronals, coronal · coronal · 0.93mm/px · 3 of 123 slices shown]
[im 41/123  soft-tissue]
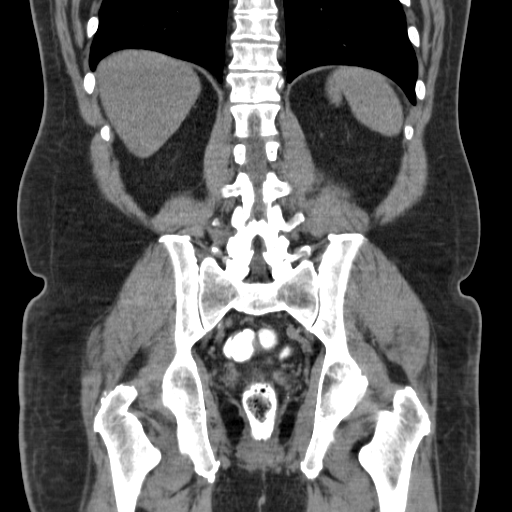
[im 55/123  soft-tissue]
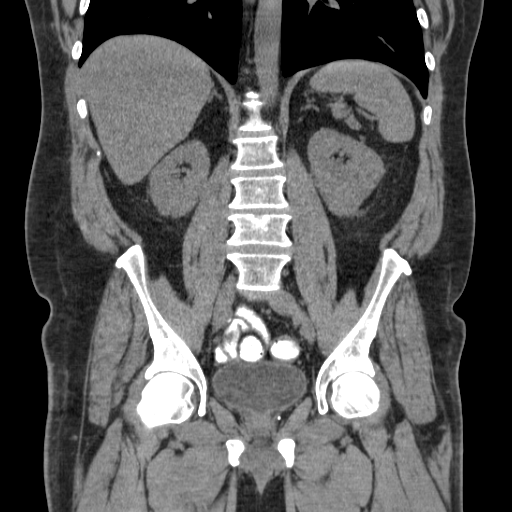
[im 68/123  soft-tissue]
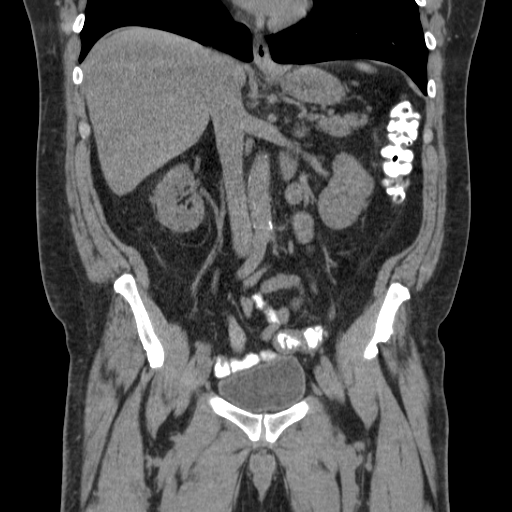

[17 of 46 positions shown; findings below may reference images not displayed]

FINDINGS: There is no nephrolithiasis or hydroureteronephrosis bilaterally.
There is mild bilateral perinephric stranding chronic. There is
diffuse fatty infiltration of liver. The spleen, pancreas,
gallbladder, adrenal glands are normal. There is atherosclerosis of
the abdominal aorta without aneurysmal dilatation. There is no
abdominal lymphadenopathy. There is a small hiatal hernia. There is
no small bowel obstruction. There is diverticulosis of colon without
diverticulitis. The appendix is not seen but no inflammation is
noted around cecum.

Fluid-filled bladder is normal. The lung bases are clear. Mild
degenerative joint changes of the spine are noted.
IMPRESSION: Diverticulosis of colon without diverticulitis. No nephrolithiasis
or hydroureteronephrosis bilaterally.

## 2014-06-10 ENCOUNTER — Inpatient Hospital Stay (HOSPITAL_COMMUNITY)
Admission: RE | Admit: 2014-06-10 | Discharge: 2014-06-12 | DRG: 247 | Disposition: A | Discharge status: Home / Self Care | Payer: Medicare Other | Source: Ambulatory Visit | Attending: Cardiovascular Disease | Admitting: Cardiovascular Disease

## 2014-06-10 DIAGNOSIS — I255 Ischemic cardiomyopathy: Secondary | ICD-10-CM | POA: Diagnosis present

## 2014-06-10 DIAGNOSIS — E780 Pure hypercholesterolemia: Secondary | ICD-10-CM | POA: Diagnosis present

## 2014-06-10 DIAGNOSIS — I252 Old myocardial infarction: Secondary | ICD-10-CM | POA: Diagnosis not present

## 2014-06-10 DIAGNOSIS — Z79899 Other long term (current) drug therapy: Secondary | ICD-10-CM

## 2014-06-10 DIAGNOSIS — Z833 Family history of diabetes mellitus: Secondary | ICD-10-CM

## 2014-06-10 DIAGNOSIS — Z811 Family history of alcohol abuse and dependence: Secondary | ICD-10-CM | POA: Diagnosis not present

## 2014-06-10 DIAGNOSIS — Z7902 Long term (current) use of antithrombotics/antiplatelets: Secondary | ICD-10-CM | POA: Diagnosis not present

## 2014-06-10 DIAGNOSIS — Z8249 Family history of ischemic heart disease and other diseases of the circulatory system: Secondary | ICD-10-CM

## 2014-06-10 DIAGNOSIS — J449 Chronic obstructive pulmonary disease, unspecified: Secondary | ICD-10-CM | POA: Diagnosis present

## 2014-06-10 DIAGNOSIS — Y831 Surgical operation with implant of artificial internal device as the cause of abnormal reaction of the patient, or of later complication, without mention of misadventure at the time of the procedure: Secondary | ICD-10-CM | POA: Diagnosis present

## 2014-06-10 DIAGNOSIS — Z955 Presence of coronary angioplasty implant and graft: Secondary | ICD-10-CM

## 2014-06-10 DIAGNOSIS — T82858A Stenosis of vascular prosthetic devices, implants and grafts, initial encounter: Secondary | ICD-10-CM | POA: Diagnosis present

## 2014-06-10 DIAGNOSIS — F17290 Nicotine dependence, other tobacco product, uncomplicated: Secondary | ICD-10-CM | POA: Diagnosis present

## 2014-06-10 DIAGNOSIS — I472 Ventricular tachycardia: Secondary | ICD-10-CM | POA: Diagnosis present

## 2014-06-10 DIAGNOSIS — R0602 Shortness of breath: Secondary | ICD-10-CM | POA: Diagnosis present

## 2014-06-10 DIAGNOSIS — F419 Anxiety disorder, unspecified: Secondary | ICD-10-CM | POA: Diagnosis present

## 2014-06-10 DIAGNOSIS — Z7982 Long term (current) use of aspirin: Secondary | ICD-10-CM

## 2014-06-10 DIAGNOSIS — I214 Non-ST elevation (NSTEMI) myocardial infarction: Secondary | ICD-10-CM | POA: Diagnosis present

## 2014-06-10 DIAGNOSIS — I10 Essential (primary) hypertension: Secondary | ICD-10-CM | POA: Diagnosis present

## 2014-06-10 DIAGNOSIS — I2510 Atherosclerotic heart disease of native coronary artery without angina pectoris: Secondary | ICD-10-CM | POA: Diagnosis present

## 2014-06-10 LAB — COMPREHENSIVE METABOLIC PANEL
ALBUMIN: 3.5 g/dL (ref 3.5–5.2)
ALT: 17 U/L (ref 0–53)
AST: 22 U/L (ref 0–37)
Alkaline Phosphatase: 54 U/L (ref 39–117)
Anion gap: 5 (ref 5–15)
BUN: 13 mg/dL (ref 6–23)
CALCIUM: 8.8 mg/dL (ref 8.4–10.5)
CO2: 29 mmol/L (ref 19–32)
Chloride: 100 mmol/L (ref 96–112)
Creatinine, Ser: 1.08 mg/dL (ref 0.50–1.35)
GFR calc Af Amer: 83 mL/min — ABNORMAL LOW (ref >90)
GFR calc non Af Amer: 72 mL/min — ABNORMAL LOW (ref >90)
Glucose, Bld: 85 mg/dL (ref 70–99)
Potassium: 3.7 mmol/L (ref 3.5–5.1)
Sodium: 134 mmol/L — ABNORMAL LOW (ref 135–145)
Total Bilirubin: 0.5 mg/dL (ref 0.3–1.2)
Total Protein: 7.1 g/dL (ref 6.0–8.3)

## 2014-06-10 LAB — CBC WITH DIFFERENTIAL/PLATELET
BASOS PCT: 1 % (ref 0–1)
Basophils Absolute: 0.1 10*3/uL (ref 0.0–0.1)
Eosinophils Absolute: 0.5 10*3/uL (ref 0.0–0.7)
Eosinophils Relative: 4 % (ref 0–5)
HEMATOCRIT: 44.7 % (ref 39.0–52.0)
Hemoglobin: 15 g/dL (ref 13.0–17.0)
LYMPHS PCT: 34 % (ref 12–46)
Lymphs Abs: 4.4 10*3/uL — ABNORMAL HIGH (ref 0.7–4.0)
MCH: 31 pg (ref 26.0–34.0)
MCHC: 33.6 g/dL (ref 30.0–36.0)
MCV: 92.4 fL (ref 78.0–100.0)
Monocytes Absolute: 0.9 10*3/uL (ref 0.1–1.0)
Monocytes Relative: 7 % (ref 3–12)
NEUTROS PCT: 54 % (ref 43–77)
Neutro Abs: 6.9 10*3/uL (ref 1.7–7.7)
PLATELETS: 305 10*3/uL (ref 150–400)
RBC: 4.84 MIL/uL (ref 4.22–5.81)
RDW: 13.5 % (ref 11.5–15.5)
WBC: 12.8 10*3/uL — ABNORMAL HIGH (ref 4.0–10.5)

## 2014-06-10 LAB — TROPONIN I: Troponin I: 0.18 ng/mL — ABNORMAL HIGH (ref <0.031)

## 2014-06-10 MED ORDER — ASPIRIN EC 81 MG PO TBEC
81.0000 mg | DELAYED_RELEASE_TABLET | Freq: Every day | ORAL | Status: DC
  Administered 2014-06-11: 81 mg via ORAL
  Filled 2014-06-10: qty 1

## 2014-06-10 MED ORDER — ASPIRIN 81 MG PO CHEW: 324.0000 mg | CHEWABLE_TABLET | ORAL | Status: DC

## 2014-06-10 MED ORDER — CARVEDILOL 6.25 MG PO TABS
6.2500 mg | ORAL_TABLET | Freq: Two times a day (BID) | ORAL | Status: DC
  Administered 2014-06-10 – 2014-06-12 (×3): 6.25 mg via ORAL
  Filled 2014-06-10 (×6): qty 1

## 2014-06-10 MED ORDER — ONDANSETRON HCL 4 MG/2ML IJ SOLN: 4.0000 mg | Freq: Four times a day (QID) | INTRAMUSCULAR | Status: DC | PRN

## 2014-06-10 MED ORDER — HEPARIN BOLUS VIA INFUSION
4000.0000 [IU] | Freq: Once | INTRAVENOUS | Status: AC
  Administered 2014-06-10: 4000 [IU] via INTRAVENOUS
  Filled 2014-06-10: qty 4000

## 2014-06-10 MED ORDER — EZETIMIBE 10 MG PO TABS
10.0000 mg | ORAL_TABLET | Freq: Every day | ORAL | Status: DC
  Administered 2014-06-11 – 2014-06-12 (×2): 10 mg via ORAL
  Filled 2014-06-10 (×2): qty 1

## 2014-06-10 MED ORDER — ASPIRIN 300 MG RE SUPP: 300.0000 mg | RECTAL | Status: DC

## 2014-06-10 MED ORDER — SODIUM CHLORIDE 0.9 % IV SOLN
INTRAVENOUS | Status: DC
  Administered 2014-06-10: 20:00:00 via INTRAVENOUS

## 2014-06-10 MED ORDER — NITROGLYCERIN 0.4 MG/HR TD PT24
0.4000 mg | MEDICATED_PATCH | Freq: Every day | TRANSDERMAL | Status: DC
  Administered 2014-06-10: 0.4 mg via TRANSDERMAL
  Filled 2014-06-10 (×2): qty 1

## 2014-06-10 MED ORDER — TICAGRELOR 90 MG PO TABS
90.0000 mg | ORAL_TABLET | Freq: Two times a day (BID) | ORAL | Status: DC
  Administered 2014-06-11: 90 mg via ORAL
  Filled 2014-06-10 (×3): qty 1

## 2014-06-10 MED ORDER — ATORVASTATIN CALCIUM 40 MG PO TABS
40.0000 mg | ORAL_TABLET | Freq: Every day | ORAL | Status: DC
  Filled 2014-06-10 (×2): qty 1

## 2014-06-10 MED ORDER — CARVEDILOL 6.25 MG PO TABS
6.2500 mg | ORAL_TABLET | Freq: Two times a day (BID) | ORAL | Status: DC
  Filled 2014-06-10: qty 1

## 2014-06-10 MED ORDER — NITROGLYCERIN 0.4 MG SL SUBL: 0.4000 mg | SUBLINGUAL_TABLET | SUBLINGUAL | Status: DC | PRN

## 2014-06-10 MED ORDER — POTASSIUM CHLORIDE CRYS ER 10 MEQ PO TBCR
10.0000 meq | EXTENDED_RELEASE_TABLET | Freq: Every day | ORAL | Status: DC
  Administered 2014-06-11: 10 meq via ORAL
  Filled 2014-06-10: qty 1

## 2014-06-10 MED ORDER — IPRATROPIUM-ALBUTEROL 20-100 MCG/ACT IN AERS: 1.0000 | INHALATION_SPRAY | Freq: Every day | RESPIRATORY_TRACT | Status: DC | PRN

## 2014-06-10 MED ORDER — ACETAMINOPHEN 325 MG PO TABS: 650.0000 mg | ORAL_TABLET | ORAL | Status: DC | PRN

## 2014-06-10 MED ORDER — IPRATROPIUM-ALBUTEROL 0.5-2.5 (3) MG/3ML IN SOLN: 3.0000 mL | Freq: Every day | RESPIRATORY_TRACT | Status: DC | PRN

## 2014-06-10 MED ORDER — HEPARIN (PORCINE) IN NACL 100-0.45 UNIT/ML-% IJ SOLN
1300.0000 [IU]/h | INTRAMUSCULAR | Status: DC
  Administered 2014-06-10: 1300 [IU]/h via INTRAVENOUS
  Filled 2014-06-10 (×2): qty 250

## 2014-06-10 NOTE — H&P (Signed)
Referring Physician:  [name] Hutchinson is an 64 y.o. male.                       Chief Complaint: Known CAD, with shortness of breath on exertion.  HPI: 64 year old male with 1 month history of chest pain and shortness of breath on exertion underwent cardiac cath in [place] and had multivessel native vessel coronary artery disease. He has 3 stents in LAD with mild to moderate disease, RCA with mild diffuse disease and Left Circumflex artery with severe multiple lesions with good distal flow . Bypass surgery was recommended but patient wants to try stent placement. After review of cineangiogram with two interventional cardiologists and one cardiovascular thoracic surgeon it was decided to place 2 or 3 stents in Left circumflex coronary artery.   Past Medical History  Diagnosis Date  . COPD (chronic obstructive pulmonary disease)   . CHF (congestive heart failure)     pt denies 05/15/2013  . Stroke   . Coronary artery disease   . Stented coronary artery     x3  . Smoking   . Myocardial infarction 2006    x 3      Past Surgical History  Procedure Laterality Date  . Stents      3 stents in heart since 2005  . Appendectomy      Family History  Problem Relation Age of Onset  . Brain cancer Mother   . Congestive Heart Failure Father   . Colon cancer Neg Hx   . Congestive Heart Failure Paternal Grandfather   . Congestive Heart Failure Paternal Grandmother   . Colitis Paternal Grandmother   . Diabetes Father   . Alcoholism Father    Social History:  reports that he has been smoking Cigars.  He has never used smokeless tobacco. He reports that he does not drink alcohol or use illicit drugs.  Allergies: No Known Allergies  Medications Prior to Admission  Medication Sig Dispense Refill  . aspirin 81 MG tablet Take 162 mg by mouth daily.     Marland Kitchen atorvastatin (LIPITOR) 40 MG tablet   0  . carvedilol (COREG CR) 20 MG 24 hr capsule Take 20 mg by mouth daily.    . carvedilol (COREG)  6.25 MG tablet   0  . ezetimibe (ZETIA) 10 MG tablet Take 10 mg by mouth daily.    . furosemide (LASIX) 40 MG tablet Take 40 mg by mouth daily.     Marland Kitchen MOVIPREP 100 G SOLR Take as directed. 1 kit 0  . nitroGLYCERIN (NITRODUR - DOSED IN MG/24 HR) 0.2 mg/hr patch   0  . nitroGLYCERIN (NITRODUR - DOSED IN MG/24 HR) 0.4 mg/hr patch   0  . nitroGLYCERIN (NITROSTAT) 0.4 MG SL tablet Place 0.4 mg under the tongue every 5 (five) minutes as needed for chest pain.    . potassium chloride (K-DUR) 10 MEQ tablet Take 10 mEq by mouth daily.     . potassium chloride (MICRO-K) 10 MEQ CR capsule   2    No results found for this or any previous visit (from the past 48 hour(s)). No results found.  Review Of Systems No weight loss, Wears reading glasses, Positive COPD, + Stroke, + hypertension, + hyprtlipidemia, + abdominal pain. No hepatitis.  There were no vitals taken for this visit. Physical Exam  Constitutional: He appears well-developed and well-nourished.  HENT: Normocephalic and atraumatic, Hazel eyes, Conj-pink, Sclera-white. Pupils are equal, round,  and reactive to light.  Neck: Normal range of motion. Neck supple.  Cardiovascular: Normal rate, normal heart sounds and intact distal pulses.  Pulmonary/Chest: Effort normal and breath sounds normal.  Abdominal: Bowel sounds are normal. He exhibits no distension.   Musculoskeletal: Normal range of motion. He exhibits no edema and no tenderness.  Neurological: He is alert and oriented to person, place, and time. He has normal strength. No cranial nerve deficit or sensory deficit.  Skin: Skin is warm and dry. No rash noted.  Psychiatric: He has a normal mood and affect.   Assessment/Plan Native vessel multi-vesel CAD Hypertension COPD Obesity  Admit/ Cardiac cath/PTCA Left circumflex coronary artery           Dr. Terrence Dupont notified  Birdie Riddle, MD  06/10/2014, 7:37 PM

## 2014-06-10 NOTE — Progress Notes (Signed)
ANTICOAGULATION CONSULT NOTE - Initial Consult  Pharmacy Consult for Heparin Indication: chest pain/ACS  No Known Allergies  Patient Measurements:   Heparin Dosing Weight:   Vital Signs:    Labs: No results for input(s): HGB, HCT, PLT, APTT, LABPROT, INR, HEPARINUNFRC, CREATININE, CKTOTAL, CKMB, TROPONINI in the last 72 hours.  CrCl cannot be calculated (Unknown ideal weight.).   Medical History: Past Medical History  Diagnosis Date  . COPD (chronic obstructive pulmonary disease)   . CHF (congestive heart failure)     pt denies 05/15/2013  . Stroke   . Coronary artery disease   . Stented coronary artery     x3  . Smoking   . Myocardial infarction 2006    x 3    Medications:  Scheduled:  . [START ON 06/11/2014] aspirin EC  81 mg Oral Daily  . atorvastatin  40 mg Oral q1800  . carvedilol  6.25 mg Oral BID WC  . nitroGLYCERIN  0.4 mg Transdermal Daily  . [START ON 06/11/2014] ticagrelor  90 mg Oral BID    Assessment: 64yo male with known CAD and SOB on exertion, to start Heparin.  He has received no heparin and his baseline labs were just drawn.  Per d/w pt, he has a history of GI bleeding d/t diverticulitis ~6547mo ago which resolved with antibiotics and he has had no further issues with this.  He has had no other bleeding issues; he is on no anticoagulant medications.  Goal of Therapy:  Heparin level 0.3-0.7 units/ml Monitor platelets by anticoagulation protocol: Yes   Plan:  -Heparin 4000 units IV x 1, 1300 units/hr -Heparin level in 6 hours -Daily HL, CBC  Marisue HumbleKendra Timarion Agcaoili, PharmD Clinical Pharmacist [facility]

## 2014-06-10 NOTE — Consult Note (Signed)
Reason for Consult: Recurrent chest pain associated with shortness of breath/multivessel CAD Referring Physician:. 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  HPI: Patient is 64 year old male with past medical history significant for coronary artery disease history of anterolateral wall myocardial infarction in [address] requiring multiple stents to LAD, history of ischemic cardiomyopathy, COPD, history 50 pack years of tobacco abuse quit approximately 2-1/2 weeks ago, hypercholesteremia, history of questionable CVA, was admitted earlier today for elective PCI to proximal mid and distal left circumflex. Patient apparently underwent cardiac catheterization in [place] while he was visiting her daughter had argument with his grandson and developed chest pain requiring admission followed by left cardiac catheterization which showed mild to moderate LAD and RCA stenosis and severe multiple sequential stenosis in proximal mid and distal left circumflex. Patient was given option for CABG versus PCI but opted to come to [facility] for further management. Patient states he has been having recurrent retrosternal chest pain associated with shortness of breath for last few weeks almost daily. Denies any nausea vomiting diaphoresis denies PND orthopnea leg swelling EKG showed normal sinus rhythm with old anterolateral wall MI with ST-T wave changes in anterolateral leads. And also was noted to have minimally elevated troponin I. Patient presently denies any chest pain.  Past Medical History  Diagnosis Date  . COPD (chronic obstructive pulmonary disease)   . CHF (congestive heart failure)     pt denies 05/15/2013  . Stroke   . Coronary artery disease   . Stented coronary artery     x3  . Smoking   . Myocardial infarction 2006    x 3    Past Surgical History  Procedure Laterality Date  . Stents      3 stents in heart since 2005  . Appendectomy      Family History  Problem Relation Age of Onset  .  Brain cancer Mother   . Congestive Heart Failure Father   . Colon cancer Neg Hx   . Congestive Heart Failure Paternal Grandfather   . Congestive Heart Failure Paternal Grandmother   . Colitis Paternal Grandmother   . Diabetes Father   . Alcoholism Father     Social History:  reports that he has been smoking Cigars.  He has never used smokeless tobacco. He reports that he does not drink alcohol or use illicit drugs.  Allergies: No Known Allergies  Medications: I have reviewed the patient's current medications.  Results for orders placed or performed during the hospital encounter of 06/10/14 (from the past 48 hour(s))  Comprehensive metabolic panel     Status: Abnormal   Collection Time: 06/10/14  8:33 PM  Result Value Ref Range   Sodium 134 (L) 135 - 145 mmol/L   Potassium 3.7 3.5 - 5.1 mmol/L   Chloride 100 96 - 112 mmol/L   CO2 29 19 - 32 mmol/L   Glucose, Bld 85 70 - 99 mg/dL   BUN 13 6 - 23 mg/dL   Creatinine, Ser 1.08 0.50 - 1.35 mg/dL   Calcium 8.8 8.4 - 10.5 mg/dL   Total Protein 7.1 6.0 - 8.3 g/dL   Albumin 3.5 3.5 - 5.2 g/dL   AST 22 0 - 37 U/L   ALT 17 0 - 53 U/L   Alkaline Phosphatase 54 39 - 117 U/L   Total Bilirubin 0.5 0.3 - 1.2 mg/dL   GFR calc non Af Amer 72 (L) >90 mL/min   GFR calc Af Amer 83 (L) >90  mL/min    Comment: (NOTE) The eGFR has been calculated using the CKD EPI equation. This calculation has not been validated in all clinical situations. eGFR's persistently <90 mL/min signify possible Chronic Kidney Disease.    Anion gap 5 5 - 15  CBC WITH DIFFERENTIAL     Status: Abnormal   Collection Time: 06/10/14  8:33 PM  Result Value Ref Range   WBC 12.8 (H) 4.0 - 10.5 K/uL   RBC 4.84 4.22 - 5.81 MIL/uL   Hemoglobin 15.0 13.0 - 17.0 g/dL   HCT 44.7 39.0 - 52.0 %   MCV 92.4 78.0 - 100.0 fL   MCH 31.0 26.0 - 34.0 pg   MCHC 33.6 30.0 - 36.0 g/dL   RDW 13.5 11.5 - 15.5 %   Platelets 305 150 - 400 K/uL   Neutrophils Relative % 54 43 - 77 %   Neutro  Abs 6.9 1.7 - 7.7 K/uL   Lymphocytes Relative 34 12 - 46 %   Lymphs Abs 4.4 (H) 0.7 - 4.0 K/uL   Monocytes Relative 7 3 - 12 %   Monocytes Absolute 0.9 0.1 - 1.0 K/uL   Eosinophils Relative 4 0 - 5 %   Eosinophils Absolute 0.5 0.0 - 0.7 K/uL   Basophils Relative 1 0 - 1 %   Basophils Absolute 0.1 0.0 - 0.1 K/uL  Troponin I-(serum)     Status: Abnormal   Collection Time: 06/10/14  8:33 PM  Result Value Ref Range   Troponin I 0.18 (H) <0.031 ng/mL    Comment:        PERSISTENTLY INCREASED TROPONIN VALUES IN THE RANGE OF 0.04-0.49 ng/mL CAN BE SEEN IN:       -UNSTABLE ANGINA       -CONGESTIVE HEART FAILURE       -MYOCARDITIS       -CHEST TRAUMA       -ARRYHTHMIAS       -LATE PRESENTING MYOCARDIAL INFARCTION       -COPD   CLINICAL FOLLOW-UP RECOMMENDED.     No results found.  Review of Systems  Constitutional: Negative for fever and chills.  Eyes: Negative for double vision and photophobia.  Respiratory: Positive for shortness of breath. Negative for cough and sputum production.   Cardiovascular: Positive for chest pain. Negative for palpitations, orthopnea and claudication.  Gastrointestinal: Negative for nausea, vomiting and abdominal pain.  Genitourinary: Negative for dysuria.  Neurological: Negative for dizziness and headaches.   Blood pressure 109/75, pulse 68, temperature 97.9 F (36.6 C), temperature source Oral, resp. rate 18, height 5' 10"  (1.778 m), weight 107.1 kg (236 lb 1.8 oz), SpO2 98 %. Physical Exam  Constitutional: He is oriented to person, place, and time.  HENT:  Head: Normocephalic and atraumatic.  Mouth/Throat: No oropharyngeal exudate.  Eyes: Conjunctivae are normal. Pupils are equal, round, and reactive to light. Left eye exhibits no discharge. No scleral icterus.  Neck: Normal range of motion. Neck supple. No JVD present.  Cardiovascular: Normal rate and regular rhythm.   Murmur: Soft systolic murmur and S4 gallop noted. Respiratory: Effort  normal and breath sounds normal. No respiratory distress. He has no wheezes. He has no rales.  GI: Soft. Bowel sounds are normal. He exhibits no distension. There is no tenderness. There is no rebound.  Musculoskeletal: He exhibits no edema or tenderness.  Neurological: He is alert and oriented to person, place, and time.    Assessment/Plan: Acute coronary syndrome Multivessel  CAD History of anterolateral wall  MI in the past status post PCI to LAD in the past Ischemic myopathy Hypertension Hypercholesteremia COPD Tobacco abuse Morbid obesity Plan Agree with present management Discussed with patient at length regarding left cardiac cath findings and various options of treatment including PTCA stenting to left circumflex and its risk and benefits i.e. death MI stroke need for emergency CABG local vascular complications risk of restenosis etc. and consents for PCI .  Rickey Hutchinson N 06/10/2014, 11:15 PM

## 2014-06-11 ENCOUNTER — Encounter (HOSPITAL_COMMUNITY)
Admission: RE | Disposition: A | Discharge status: Home / Self Care | Payer: Medicare Other | Source: Ambulatory Visit | Attending: Cardiovascular Disease

## 2014-06-11 ENCOUNTER — Encounter (HOSPITAL_COMMUNITY): Payer: Self-pay | Admitting: *Deleted

## 2014-06-11 ENCOUNTER — Other Ambulatory Visit: Payer: Self-pay

## 2014-06-11 HISTORY — PX: LEFT HEART CATHETERIZATION WITH CORONARY ANGIOGRAM: SHX5451

## 2014-06-11 LAB — BASIC METABOLIC PANEL
Anion gap: 6 (ref 5–15)
BUN: 14 mg/dL (ref 6–23)
CHLORIDE: 104 mmol/L (ref 96–112)
CO2: 27 mmol/L (ref 19–32)
Calcium: 8.9 mg/dL (ref 8.4–10.5)
Creatinine, Ser: 1.02 mg/dL (ref 0.50–1.35)
GFR calc Af Amer: 89 mL/min — ABNORMAL LOW (ref >90)
GFR calc non Af Amer: 77 mL/min — ABNORMAL LOW (ref >90)
GLUCOSE: 84 mg/dL (ref 70–99)
POTASSIUM: 4.1 mmol/L (ref 3.5–5.1)
Sodium: 137 mmol/L (ref 135–145)

## 2014-06-11 LAB — LIPID PANEL
CHOL/HDL RATIO: 2.5 ratio
CHOLESTEROL: 84 mg/dL (ref 0–200)
HDL: 33 mg/dL — AB (ref >39)
LDL Cholesterol: 30 mg/dL (ref 0–99)
Triglycerides: 103 mg/dL (ref <150)
VLDL: 21 mg/dL (ref 0–40)

## 2014-06-11 LAB — TROPONIN I
Troponin I: 0.17 ng/mL — ABNORMAL HIGH (ref <0.031)
Troponin I: 0.15 ng/mL — ABNORMAL HIGH (ref <0.031)

## 2014-06-11 LAB — POCT ACTIVATED CLOTTING TIME: ACTIVATED CLOTTING TIME: 349 s

## 2014-06-11 LAB — CBC
HCT: 42.5 % (ref 39.0–52.0)
HEMOGLOBIN: 14.1 g/dL (ref 13.0–17.0)
MCH: 31.1 pg (ref 26.0–34.0)
MCHC: 33.2 g/dL (ref 30.0–36.0)
MCV: 93.8 fL (ref 78.0–100.0)
Platelets: 265 10*3/uL (ref 150–400)
RBC: 4.53 MIL/uL (ref 4.22–5.81)
RDW: 13.5 % (ref 11.5–15.5)
WBC: 11.3 10*3/uL — AB (ref 4.0–10.5)

## 2014-06-11 LAB — HEPARIN LEVEL (UNFRACTIONATED): Heparin Unfractionated: 0.44 IU/mL (ref 0.30–0.70)

## 2014-06-11 LAB — PROTIME-INR
INR: 1.09 (ref 0.00–1.49)
Prothrombin Time: 14.3 seconds (ref 11.6–15.2)

## 2014-06-11 SURGERY — LEFT HEART CATHETERIZATION WITH CORONARY ANGIOGRAM
Anesthesia: LOCAL

## 2014-06-11 MED ORDER — BIVALIRUDIN 250 MG IV SOLR
INTRAVENOUS | Status: AC
  Filled 2014-06-11: qty 250

## 2014-06-11 MED ORDER — SODIUM CHLORIDE 0.9 % IV SOLN
INTRAVENOUS | Status: AC
  Administered 2014-06-11: 19:00:00 via INTRAVENOUS

## 2014-06-11 MED ORDER — FENTANYL CITRATE 0.05 MG/ML IJ SOLN
INTRAMUSCULAR | Status: AC
  Filled 2014-06-11: qty 2

## 2014-06-11 MED ORDER — ACETAMINOPHEN 325 MG PO TABS: 650.0000 mg | ORAL_TABLET | ORAL | Status: DC | PRN

## 2014-06-11 MED ORDER — TICAGRELOR 90 MG PO TABS
90.0000 mg | ORAL_TABLET | Freq: Two times a day (BID) | ORAL | Status: DC
  Administered 2014-06-11 – 2014-06-12 (×2): 90 mg via ORAL
  Filled 2014-06-11 (×3): qty 1

## 2014-06-11 MED ORDER — RAMIPRIL 2.5 MG PO CAPS
2.5000 mg | ORAL_CAPSULE | Freq: Every day | ORAL | Status: DC
Start: 2014-06-11 — End: 2014-06-12
  Filled 2014-06-11 (×3): qty 1

## 2014-06-11 MED ORDER — ONDANSETRON HCL 4 MG/2ML IJ SOLN
4.0000 mg | Freq: Four times a day (QID) | INTRAMUSCULAR | Status: DC | PRN
Start: 2014-06-11 — End: 2014-06-12

## 2014-06-11 MED ORDER — ASPIRIN 81 MG PO CHEW
81.0000 mg | CHEWABLE_TABLET | Freq: Every day | ORAL | Status: DC
  Administered 2014-06-12: 11:00:00 81 mg via ORAL
  Filled 2014-06-11: qty 1

## 2014-06-11 MED ORDER — LIDOCAINE HCL (PF) 1 % IJ SOLN
INTRAMUSCULAR | Status: AC
  Filled 2014-06-11: qty 30

## 2014-06-11 MED ORDER — ALBUTEROL SULFATE (2.5 MG/3ML) 0.083% IN NEBU
INHALATION_SOLUTION | RESPIRATORY_TRACT | Status: AC
  Filled 2014-06-11: qty 3

## 2014-06-11 MED ORDER — ALBUTEROL SULFATE (2.5 MG/3ML) 0.083% IN NEBU
2.5000 mg | INHALATION_SOLUTION | Freq: Once | RESPIRATORY_TRACT | Status: AC
  Administered 2014-06-11: 2.5 mg via RESPIRATORY_TRACT

## 2014-06-11 MED ORDER — HEPARIN (PORCINE) IN NACL 2-0.9 UNIT/ML-% IJ SOLN
INTRAMUSCULAR | Status: AC
  Filled 2014-06-11: qty 1000

## 2014-06-11 MED ORDER — MIDAZOLAM HCL 2 MG/2ML IJ SOLN
INTRAMUSCULAR | Status: AC
  Filled 2014-06-11: qty 2

## 2014-06-11 MED ORDER — NITROGLYCERIN IN D5W 200-5 MCG/ML-% IV SOLN: 5.0000 ug/min | INTRAVENOUS | Status: DC

## 2014-06-11 MED ORDER — NITROGLYCERIN 1 MG/10 ML FOR IR/CATH LAB
INTRA_ARTERIAL | Status: AC
  Filled 2014-06-11: qty 10

## 2014-06-11 NOTE — Progress Notes (Addendum)
ANTICOAGULATION CONSULT NOTE - Follow Up Consult  Pharmacy Consult for Heparin  Indication: chest pain/ACS  Labs:  Recent Labs  06/10/14 2033 06/11/14 0057 06/11/14 0609  HGB 15.0  --   --   HCT 44.7  --   --   PLT 305  --   --   LABPROT  --  14.3  --   INR  --  1.09  --   HEPARINUNFRC  --   --  0.44  CREATININE 1.08 1.02  --   TROPONINI 0.18* 0.17*  --      Assessment: 64 year old male continues on heparin for CP Cardiac cath planned Heparin level therapeutic  Goal of Therapy:  Heparin level 0.3-0.7 units/ml Monitor platelets by anticoagulation protocol: Yes   Plan:  -Continue heparin at 1300 units/hr -Follow up after cath  Thank you. Okey RegalLisa Lorelai Huyser, PharmD [phone]  06/11/2014,6:59 AM

## 2014-06-11 NOTE — Progress Notes (Signed)
Dr Tyler AasM. Harwani in to see pt and talk to him about the results of the procedure and the plan of care.

## 2014-06-11 NOTE — CV Procedure (Signed)
PTCA/stenting to left circumflex report dictated on 06/11/2014 dictation number is [id number]

## 2014-06-11 NOTE — Progress Notes (Signed)
Eating Malaw[place]urkey sandwich. Waiting for 6500 bed.

## 2014-06-11 NOTE — Progress Notes (Signed)
Subjective:  Patient denies any chest pain or shortness of breath tolerated PCI to left circumflex with excellent angiographic results.  Objective:  Vital Signs in the last 24 hours: Temp:  [97.9 F (36.6 C)] 97.9 F (36.6 C) (02/09 0517) Pulse Rate:  [60-82] 65 (02/09 1740) Resp:  [7-23] 9 (02/09 1740) BP: (104-140)/(60-109) 132/76 mmHg (02/09 1740) SpO2:  [94 %-99 %] 97 % (02/09 1740) Weight:  [106 kg (233 lb 11 oz)-107.1 kg (236 lb 1.8 oz)] 106 kg (233 lb 11 oz) (02/09 0444)  Intake/Output from previous day: 02/08 0701 - 02/09 0700 In: -  Out: 650 [Urine:650] Intake/Output from this shift: Total I/O In: 0  Out: 800 [Urine:800]  Physical Exam: Neck: no adenopathy, no carotid bruit, no JVD and supple, symmetrical, trachea midline Lungs: Clear anterolaterally Heart: regular rate and rhythm, S1, S2 normal and Soft systolic murmur noted Abdomen: soft, non-tender; bowel sounds normal; no masses,  no organomegaly Extremities: extremities normal, atraumatic, no cyanosis or edema and Right groin stable no evidence of hematoma or ecchymosis dressing dry  Lab Results:  Recent Labs  06/10/14 2033 06/11/14 0824  WBC 12.8* 11.3*  HGB 15.0 14.1  PLT 305 265    Recent Labs  06/10/14 2033 06/11/14 0057  NA 134* 137  K 3.7 4.1  CL 100 104  CO2 29 27  GLUCOSE 85 84  BUN 13 14  CREATININE 1.08 1.02    Recent Labs  06/11/14 0057 06/11/14 0824  TROPONINI 0.17* 0.15*   Hepatic Function Panel  Recent Labs  06/10/14 2033  PROT 7.1  ALBUMIN 3.5  AST 22  ALT 17  ALKPHOS 54  BILITOT 0.5    Recent Labs  06/11/14 0057  CHOL 84   No results for input(s): PROTIME in the last 72 hours.  Imaging: Imaging results have been reviewed and No results found.  Cardiac Studies:  Assessment/Plan:  Acute coronary syndrome status post PCI to left circumflex Multivessel CAD History of anterolateral wall MI in the past status post PCI to LAD in the past Ischemic cardio.  myopathy Hypertension Hypercholesteremia COPD Tobacco abuse Morbid obesity Plan Continue present management  Add low-dose ACE inhibitor as per orders. I will sign off please call if needed   LOS: 1 day    Rickey Hutchinson N 06/11/2014, 6:04 PM

## 2014-06-11 NOTE — Progress Notes (Signed)
Angiomax completed and turned off. 

## 2014-06-11 NOTE — Interval H&P Note (Signed)
History and Physical Interval Note:  06/11/2014 11:06 AM  Rickey Hutchinson  has presented today for surgery, with the diagnosis of cp  The various methods of treatment have been discussed with the patient and family. After consideration of risks, benefits and other options for treatment, the patient has consented to  Procedure(s): LEFT HEART CATHETERIZATION WITH CORONARY ANGIOGRAM (N/A) as a surgical intervention .  The patient's history has been reviewed, patient examined, no change in status, stable for surgery.  I have reviewed the patient's chart and labs.  Questions were answered to the patient's satisfaction.     [name] S

## 2014-06-11 NOTE — Progress Notes (Signed)
Dr. Sharyn LullHarwani paged and made aware of post 12-lead EKG interpretation. Asked to page Dr. Algie CofferKadakia. Dr. Algie CofferKadakia paged and  made aware; looked at result via EPIC. Dr. Algie CofferKadakia states false alarm. Patient pain free. Respiratory Therapy called to request a breathing treatment.

## 2014-06-11 NOTE — CV Procedure (Signed)
PROCEDURE:  Left heart catheterization with selective coronary angiography.  CLINICAL HISTORY:  This is a 64 year old male with recurrent chest pressure and shortness of breath has known CAD and abnormal troponin-I.  The risks, benefits, and details of the procedure were explained to the patient.  The patient verbalized understanding and wanted to proceed.  Informed written consent was obtained.  PROCEDURE TECHNIQUE:  The patient was approached from the right femoral artery using a 5 French short sheath.  Left coronary angiography was done using a Judkins L4 guide catheter.  Right coronary angiography was done using a Judkins R4 guide catheter.  Left ventriculography was done using a pigtail catheter.    CONTRAST:  Total of 25 cc.  COMPLICATIONS:  None.  At the end of the procedure a manual pressure device was used for hemostasis.    HEMODYNAMICS:  Aortic pressure was 117/71.     ANGIOGRAM/CORONARY ARTERIOGRAM:   The left main coronary artery is unremarkable.  The left anterior descending artery has proximal to mid vessel diffuse disease of 30-40 % severity including multiple stents in the proximal to mid vessel area and post stent area. Distal vessel and diagonal vessels are diffusely narrow. LAD wraps around the apex of the heart providing 1/3 to half of posterior septum.  The left circumflex artery continues as OM. It has proximal eccentric 50 % stenosis, mid vessel long tapering to 90 % stenosis ending before bifurcation then inferior branch has long 70-80 % stenosis. Ramus has osteal 50 % stenosis but has small caliber.   The right coronary artery is dominant and mild diffuse disease all over.  LEFT VENTRICULOGRAM:  Left ventricular angiogram was not done. Moderate systolic dysfunction with mid to distal anterior and apical severe hypokinesia with an estimated ejection fraction of 40-45 % by prior echocardiogram.  IMPRESSION OF HEART CATHETERIZATION:   1. Minimal disease of left main  coronary artery. 2. Moderate disease of left anterior descending artery and its branches. 3. Severe disease of left circumflex artery and its branches. 4. Mild diffuse disease of dominant right coronary artery. 5. Moderate left ventricular systolic dysfunction with ejection fraction of 40-45 %.  RECOMMENDATION:   PTCA Stent placement in left circumflex by Dr. Sharyn LullHarwani.

## 2014-06-11 NOTE — Progress Notes (Signed)
Site area: rt groin  Site Prior to Removal:  Level  0, small bruise Pressure Applied For: 25 minutes Manual:   yes Patient Status During Pull:  stable Post Pull Site:  Level  0 Post Pull Instructions Given:  yes Post Pull Pulses Present: yes Dressing Applied:  tegaderm Bedrest begins @ 1615 Comments: no complications

## 2014-06-11 NOTE — Progress Notes (Signed)
Pt transported off to cath lab. P. Amo Salif Tay RN. 

## 2014-06-12 ENCOUNTER — Encounter (HOSPITAL_COMMUNITY): Payer: Self-pay | Admitting: Cardiology

## 2014-06-12 LAB — BASIC METABOLIC PANEL
ANION GAP: 3 — AB (ref 5–15)
BUN: 10 mg/dL (ref 6–23)
CALCIUM: 8.5 mg/dL (ref 8.4–10.5)
CO2: 27 mmol/L (ref 19–32)
Chloride: 107 mmol/L (ref 96–112)
Creatinine, Ser: 1.11 mg/dL (ref 0.50–1.35)
GFR, EST AFRICAN AMERICAN: 80 mL/min — AB (ref >90)
GFR, EST NON AFRICAN AMERICAN: 69 mL/min — AB (ref >90)
GLUCOSE: 94 mg/dL (ref 70–99)
Potassium: 4.5 mmol/L (ref 3.5–5.1)
SODIUM: 137 mmol/L (ref 135–145)

## 2014-06-12 LAB — CBC
HEMATOCRIT: 42.8 % (ref 39.0–52.0)
HEMOGLOBIN: 14.3 g/dL (ref 13.0–17.0)
MCH: 30.8 pg (ref 26.0–34.0)
MCHC: 33.4 g/dL (ref 30.0–36.0)
MCV: 92 fL (ref 78.0–100.0)
Platelets: 270 10*3/uL (ref 150–400)
RBC: 4.65 MIL/uL (ref 4.22–5.81)
RDW: 13.7 % (ref 11.5–15.5)
WBC: 12.7 10*3/uL — ABNORMAL HIGH (ref 4.0–10.5)

## 2014-06-12 LAB — MAGNESIUM: Magnesium: 2.2 mg/dL (ref 1.5–2.5)

## 2014-06-12 MED ORDER — POTASSIUM CHLORIDE ER 10 MEQ PO CPCR: 10.0000 meq | ORAL_CAPSULE | ORAL | Status: DC

## 2014-06-12 MED ORDER — CARVEDILOL 12.5 MG PO TABS: 12.5000 mg | ORAL_TABLET | Freq: Two times a day (BID) | ORAL | Status: AC

## 2014-06-12 MED ORDER — LISINOPRIL 5 MG PO TABS: 5.0000 mg | ORAL_TABLET | Freq: Every day | ORAL | Status: DC

## 2014-06-12 MED ORDER — TICAGRELOR 90 MG PO TABS: 90.0000 mg | ORAL_TABLET | Freq: Two times a day (BID) | ORAL | Status: DC

## 2014-06-12 MED ORDER — CARVEDILOL 12.5 MG PO TABS: 12.5000 mg | ORAL_TABLET | Freq: Two times a day (BID) | ORAL | Status: DC

## 2014-06-12 MED ORDER — CARVEDILOL 6.25 MG PO TABS
6.2500 mg | ORAL_TABLET | Freq: Once | ORAL | Status: DC
  Administered 2014-06-12: 15:00:00 6.25 mg via ORAL
  Filled 2014-06-12: qty 1

## 2014-06-12 MED ORDER — TICAGRELOR 90 MG PO TABS: 90.0000 mg | ORAL_TABLET | Freq: Two times a day (BID) | ORAL | Status: AC

## 2014-06-12 MED ORDER — LISINOPRIL 5 MG PO TABS
5.0000 mg | ORAL_TABLET | Freq: Every day | ORAL | Status: DC
Start: 2014-06-12 — End: 2014-06-12
  Administered 2014-06-12: 5 mg via ORAL
  Filled 2014-06-12 (×2): qty 1

## 2014-06-12 MED ORDER — FUROSEMIDE 40 MG PO TABS: 20.0000 mg | ORAL_TABLET | Freq: Every day | ORAL | Status: DC

## 2014-06-12 MED ORDER — ASPIRIN EC 81 MG PO TBEC: 81.0000 mg | DELAYED_RELEASE_TABLET | Freq: Every day | ORAL | Status: AC

## 2014-06-12 MED FILL — Sodium Chloride IV Soln 0.9%: INTRAVENOUS | Qty: 50 | Status: AC

## 2014-06-12 NOTE — Cardiovascular Report (Signed)
NAME:  Rickey Hutchinson, Rickey Hutchinson NO.:  [id number]  MEDICAL RECORD NO.:  [id number]  LOCATION:  6C04C                        FACILITY:  [facility]  PHYSICIAN:  Rickey Hutchinson, M.D. DATE OF BIRTH:  1950/07/08  DATE OF PROCEDURE:  06/11/2014 DATE OF DISCHARGE:                           CARDIAC CATHETERIZATION   PROCEDURE: 1. PTCA to distal mid and proximal left circumflex using initially 2.5     x 12 mm long Emerge balloon and then 2.75 x 15 mm long Emerge     balloon. 2. Successful deployment of 2.75 x 33 mm long Xience alpine drug-     eluting stent in distal circumflex. 3. Successful postdilatation of the stent using 2.75 x 8 mm long [place]     Emerge balloon. 4. Successful PTCA to mid and proximal left circumflex initially using     2.75 x 15 mm long [place] Emerge balloon and then 3.0 x 15 mm long     Emerge balloon. 5. Successful deployment of 3.0 x 33 mm long Xience alpine drug-     eluting stent in mid and proximal left circumflex. 6. Successful postdilatation of the stent using 3.5 x 20 mm long [place]     Emerge balloon.  INDICATION FOR THE PROCEDURE:  Rickey Hutchinson is a 64 year old male with past medical history significant for coronary artery disease, history of anterolateral wall myocardial infarction in 2003 x3 requiring multiple stents to LAD in [place], history of ischemic cardiomyopathy, COPD, history of 50 pack-years of tobacco abuse, quit approximately 2-1/2 weeks ago, hypercholesteremia, history of questionable CVA, was admitted yesterday for elective PCI to proximal and mid and distal left circumflex.  Patient apparently underwent cardiac catheterization in [place] while he was visiting his daughter, had argument with his grandson and developed chest pain, requiring admission followed by left cardiac catheterization which showed mild to moderate LAD and RCA stenosis, and severe multiple sequential stenosis in proximal mid and distal left circumflex.  The patient was  given option for CABG versus PCI but opted to come to [facility] for further management.  The patient states he has been having recurrent retrosternal chest pain associated with shortness of breath for last few weeks, almost daily.  The patient denies any nausea, vomiting, diaphoresis.  Denies PND, orthopnea, or leg swelling.  EKG showed normal sinus rhythm with old anterolateral wall MI with ST-T wave changes in anterolateral leads.  The patient was also noted to have minimally elevated troponin I.  The patient denies any chest pain when seen.  Due to typical anginal chest pain, critical multiple sequential stenosis in the left circumflex, discussed with patient at length regarding PTCA stenting, its risks and benefits, i.e., death, MI, stroke, need for emergency CABG, local vascular complications, etc. and consented for PCI.  Operative note the patient had mildly elevated cardiac enzymes, so underwent catheterization by Dr. Algie Hutchinson prior to PCI.  INTERVENTIONAL PROCEDURE:  Successful cath findings.  The patient had mild RCA stenosis, left main had 20% to 30% mid stenosis.  LAD has 40% to 50% proximal and 20% to 30% mid in-stent restenosis.  Left circumflex has 75% proximal and 90% to 95% mid and distal sequential stenosis.  Successful PTCA to  distal mid and proximal left circumflex was done initially using 2.5 x 12 mm long Emerge balloon followed by 2.75 x 15 mm long [place] Emerge balloon for predilatation using double wire technique followed by 2.75 x 33 mm long Xience alpine  drug-eluting stent was deployed in distal left circumflex at 10 atmospheric pressure.  This stent was post dilated using 2.75 x 8 mm long [place] Emerge balloon going up to 15 atmospheric pressure.  Distal lesion dilated from 90% to 95% to 0% residual with excellent TIMI grade 3 distal flow without evidence of dissection or distal embolization.  Next, again PTCA to mid and proximal left circumflex was done using same  2.75 x 15 mm followed by a 3.0 x 15 mm long [place] Emerge balloon for predilatation, then 3.0 x 33 mm long Xience alpine drug-eluting stent was deployed in 15 atmospheric pressure in mid and proximal left circumflex.  This stent was post dilated using 3.5 x 20 mm long [place] Emerge balloon going up to 20 atmospheric pressure. Lesion dilated from 75% in proximal portion and 90% to 95% in midportion to 0% residual with excellent TIMI grade 3 distal flow without evidence of dissection or distal embolization.  The patient received weight based Angiomax during the procedure.  The patient did receive Brilinta yesterday and this morning prior to the procedure.  The patient tolerated the procedure well.  There were no complications.  Patient was transferred to recovery room in stable condition.     Rickey OsierMohan N. Sharyn LullHarwani, M.D.     MNH/MEDQ  D:  06/11/2014  T:  06/12/2014  Job:  [id number]

## 2014-06-12 NOTE — Progress Notes (Signed)
Notified Dr.Harwani patient [facility] 31 [facility] chart to review. Patient asymptomatic and vitals are stable will continue to monitor and order to check Mg+ in the morning.

## 2014-06-12 NOTE — Care Management Utilization Note (Signed)
UR completed.    Hyrum Shaneyfelt Wise Kinley Dozier, RN, BSN Phone #[phone]  

## 2014-06-12 NOTE — Discharge Summary (Signed)
Physician Discharge Summary  Patient ID: JAMESEN STAHNKE MRN: [id number] DOB/AGE: 1950/09/07 64 y.o.  Admit date: 06/10/2014 Discharge date: 06/12/2014  Admission Diagnoses: Native vessel multi-vesel CAD H/O Stent(multiple) in LAD Hypertension COPD Obesity  Discharge Diagnoses:  Principal Problem: * Small NSTEMI * Non-sustain VT Ischemic and dilated cardiomyopathy Coronary artery disease, multivessel, native vessel coronary artery S/P Stent in Left circumflex H/O Stent (multiple) in LAD Hypertension COPD, moderate Obesity Anxiety  Discharged Condition: fair  Hospital Course: 64 year old male with 1 month history of chest pain and shortness of breath on exertion underwent cardiac cath in [place] and had multivessel native vessel coronary artery disease. He has 3 stents in LAD with mild to moderate disease, RCA with mild diffuse disease and Left Circumflex coronary artery with severe multiple lesions with good distal flow . Bypass surgery was recommended but patient wants to try stent placement. After review of cineangiogram with two interventional cardiologists and one cardiovascular thoracic surgeon it was decided to place 2 stents in Left circumflex coronary artery. He had stents placed in proximal (Xience Alpine drug eluting stent 3.0x 33 mm) and in mid to distal left circumflex coronary artery (Xience Alpine 2.75 x 33) successfully by Dr. Rinaldo Cloud on 06/11/2014. His coreg was increased and lisinopril was started for non-sustained VT, known CAD and dilated cardiomyopathy.  Consults: cardiology  Significant Diagnostic Studies: labs: Near normal CBC and BMET and potassium level. Mildly elevated troponin I but trending downward.  EKG-SR, low voltage, possible inferior ischemia and old lateral wall MI.  Cardiac cath showed  1.   Minimal disease of left main coronary artery. 2.   Moderate disease of left anterior descending artery and its branches. 3.   Severe disease of left  circumflex artery and its branches. 4     Mild diffuse disease of dominant right coronary artery. 5.    Moderate left ventricular systolic dysfunction with ejection fraction of 40-45 %.  Treatments: cardiac meds: lisinopril (Prinivil), carvedilol, furosemide, potassium, Brilinta and Atorvastatin and 2 drug eluting Xience Alpine  stents placement in proximal to mid and mid to distal area.  Discharge Exam: Blood pressure 136/77, pulse 70, temperature 97.3 F (36.3 C), temperature source Oral, resp. rate 18, height  (1.778 m), weight 106 kg (233 lb 11 oz), SpO2 100 %. HEENT: [place]/AT, Eyes-Blue, PERL, EOMI, Conjunctiva-Pink, Sclera-Non-icteric Neck: No JVD, No bruit, Trachea midline. Lungs: Clear, Bilateral. Cardiac: Regular rhythm, normal S1 and S2, no S3. II/VI systolic murmur Abdomen: Soft, non-tender. Extremities: No edema present. No cyanosis. No clubbing. CNS: AxOx3, Cranial nerves grossly intact, moves all 4 extremities. Right handed. Skin: Warm and dry.  Disposition: 01-Home or Self Care     Medication List    STOP taking these medications        ibuprofen 200 MG tablet  Commonly known as:  ADVIL,MOTRIN     MOVIPREP 100 G Solr  Generic drug:  peg 3350 powder      TAKE these medications        aspirin EC 81 MG tablet  Take 1 tablet (81 mg total) by mouth daily.     atorvastatin 40 MG tablet  Commonly known as:  LIPITOR  Take 40 mg by mouth daily.     carvedilol 12.5 MG tablet  Commonly known as:  COREG  Take 1 tablet (12.5 mg total) by mouth 2 (two) times daily with a meal.     ezetimibe 10 MG tablet  Commonly known as:  ZETIA  Take 10 mg by mouth daily.     furosemide 40 MG tablet  Commonly known as:  LASIX  Take 0.5 tablets (20 mg total) by mouth daily.     Ipratropium-Albuterol 20-100 MCG/ACT Aers respimat  Commonly known as:  COMBIVENT  Inhale 1 puff into the lungs daily as needed for shortness of breath.     lisinopril 5 MG tablet  Commonly  known as:  PRINIVIL,ZESTRIL  Take 1 tablet (5 mg total) by mouth daily.     nitroGLYCERIN 0.4 MG SL tablet  Commonly known as:  NITROSTAT  Place 0.4 mg under the tongue every 5 (five) minutes as needed for chest pain.     nitroGLYCERIN 0.4 mg/hr patch  Commonly known as:  NITRODUR - Dosed in mg/24 hr  Place 0.4 mg onto the skin 2 (two) times daily. Apply to chest wall for chest pain     potassium chloride 10 MEQ CR capsule  Commonly known as:  MICRO-K  Take 1 capsule (10 mEq total) by mouth every Monday, Wednesday, and Friday.     ticagrelor 90 MG Tabs tablet  Commonly known as:  BRILINTA  Take 1 tablet (90 mg total) by mouth 2 (two) times daily.     ticagrelor 90 MG Tabs tablet  Commonly known as:  BRILINTA  Take 1 tablet (90 mg total) by mouth 2 (two) times daily.           Follow-up Information    [facility]Jandi Swiger S, MD. Schedule an appointment as soon as possible for a visit in 5 days.   Specialty:  Cardiology   Contact information:   [address] [place] [address] [phone]       Signed: Ricki RodriguezKADAKIA,Darragh Nay S 06/12/2014, 2:36 PM

## 2014-06-12 NOTE — Progress Notes (Signed)
1000-1030 Came to walk with pt. He stated he had walked several times already and that he had been through this before and he did not need a lot of teaching. Did brief review of brilinta/ stent and gave pt booklet. Reviewed NTG use and encouraged slow return to activity. Pt declined CRP 2 as he stated his main concern right now [place] who is sick. Pt stated he knew what to eat and felt comfortable with stents as he has had them before. Will sign off as pt stated he can walk on his own. Pt very concerned about daughter. Luetta Nuttingharlene Ardith Lewman RN BSN 06/12/2014 10:30 AM

## 2014-06-12 NOTE — Progress Notes (Signed)
CARE MANAGEMENT NOTE 06/12/2014  Patient:  Rickey Hutchinson,Rickey Hutchinson   Account Number:  [id number]  Date Initiated:  06/12/2014  Documentation initiated by:  [facility]  Subjective/Objective Assessment:   Small NSTEMI     Action/Plan:   [place]ives in [place] dtr   Anticipated DC Date:  06/12/2014   Anticipated DC Plan:  HOME/SELF CARE      DC Planning Services  CM consult      Choice offered to / List presented to:             Status of service:  Completed, signed off Medicare Important Message given?  NA - LOS <3 / Initial given by admissions (If response is "NO", the following Medicare IM given date fields will be blank) Date Medicare IM given:   Medicare IM given by:   Date Additional Medicare IM given:   Additional Medicare IM given by:    Discharge Disposition:  HOME/SELF CARE  Per UR Regulation:    If discussed at Long Length of Stay Meetings, dates discussed:    Comments:  06/12/2014 1200 NCM spoke to pt and states he able to get his medications for low copay price of $9.00. States he was living in [place] care of his dtr but had to move back temp with his sister until he is well. He has Brilinta 30 day trial card. Rickey DonningAlesia Samirah Scarpati RN CCM Case Mgmt phone [phone]

## 2014-06-12 NOTE — Progress Notes (Signed)
Ref: [name], MD   Subjective:  Feeling better. No significant change in CBC or BMET. Afebrile. 31 beat VT. Worried about sick daughter in [place], hence against rehab here.  Objective:  Vital Signs in the last 24 hours: Temp:  [97.3 F (36.3 C)-97.7 F (36.5 C)] 97.3 F (36.3 C) (02/10 1131) Pulse Rate:  [60-82] 70 (02/10 1131) Cardiac Rhythm:  [-] Normal sinus rhythm (02/09 2000) Resp:  [7-23] 18 (02/10 1131) BP: (108-143)/(59-109) 136/77 mmHg (02/10 1131) SpO2:  [94 %-100 %] 100 % (02/10 1131) Weight:  [106 kg (233 lb 11 oz)] 106 kg (233 lb 11 oz) (02/10 0012)  Physical Exam: BP Readings from Last 1 Encounters:  06/12/14 136/77    Wt Readings from Last 1 Encounters:  06/12/14 106 kg (233 lb 11 oz)    Weight change: -1.1 kg (-2 lb 6.8 oz)  HEENT: [place]/AT, Eyes-Blue, PERL, EOMI, Conjunctiva-Pink, Sclera-Non-icteric Neck: No JVD, No bruit, Trachea midline. Lungs:  Clear, Bilateral. Cardiac:  Regular rhythm, normal S1 and S2, no S3. II/VI systolic murmur Abdomen:  Soft, non-tender. Extremities:  No edema present. No cyanosis. No clubbing. CNS: AxOx3, Cranial nerves grossly intact, moves all 4 extremities. Right handed. Skin: Warm and dry.   Intake/Output from previous day: 02/09 0701 - 02/10 0700 In: 240 [P.O.:240] Out: 1950 [Urine:1950]    Lab Results: BMET    Component Value Date/Time   NA 137 06/12/2014 0424   NA 137 06/11/2014 0057   NA 134* 06/10/2014 2033   K 4.5 06/12/2014 0424   K 4.1 06/11/2014 0057   K 3.7 06/10/2014 2033   CL 107 06/12/2014 0424   CL 104 06/11/2014 0057   CL 100 06/10/2014 2033   CO2 27 06/12/2014 0424   CO2 27 06/11/2014 0057   CO2 29 06/10/2014 2033   GLUCOSE 94 06/12/2014 0424   GLUCOSE 84 06/11/2014 0057   GLUCOSE 85 06/10/2014 2033   BUN 10 06/12/2014 0424   BUN 14 06/11/2014 0057   BUN 13 06/10/2014 2033   CREATININE 1.11 06/12/2014 0424   CREATININE 1.02 06/11/2014 0057   CREATININE 1.08 06/10/2014 2033   CALCIUM 8.5 06/12/2014 0424   CALCIUM 8.9 06/11/2014 0057   CALCIUM 8.8 06/10/2014 2033   GFRNONAA 69* 06/12/2014 0424   GFRNONAA 77* 06/11/2014 0057   GFRNONAA 72* 06/10/2014 2033   GFRAA 80* 06/12/2014 0424   GFRAA 89* 06/11/2014 0057   GFRAA 83* 06/10/2014 2033   CBC    Component Value Date/Time   WBC 12.7* 06/12/2014 0424   RBC 4.65 06/12/2014 0424   HGB 14.3 06/12/2014 0424   HCT 42.8 06/12/2014 0424   PLT 270 06/12/2014 0424   MCV 92.0 06/12/2014 0424   MCH 30.8 06/12/2014 0424   MCHC 33.4 06/12/2014 0424   RDW 13.7 06/12/2014 0424   LYMPHSABS 4.4* 06/10/2014 2033   MONOABS 0.9 06/10/2014 2033   EOSABS 0.5 06/10/2014 2033   BASOSABS 0.1 06/10/2014 2033   HEPATIC Function Panel  Recent Labs  06/10/14 2033  PROT 7.1   HEMOGLOBIN A1C No components found for: HGA1C,  MPG CARDIAC ENZYMES Lab Results  Component Value Date   CKTOTAL 142 09/09/2009   CKMB 2.0 09/09/2009   TROPONINI 0.15* 06/11/2014   TROPONINI 0.17* 06/11/2014   TROPONINI 0.18* 06/10/2014   BNP No results for input(s): PROBNP in the last 8760 hours. TSH No results for input(s): TSH in the last 8760 hours. CHOLESTEROL  Recent Labs  06/11/14 0057  CHOL 84  Scheduled Meds: . aspirin  81 mg Oral Daily  . atorvastatin  40 mg Oral q1800  . carvedilol  6.25 mg Oral BID WC  . ezetimibe  10 mg Oral Daily  . ramipril  2.5 mg Oral Daily  . ticagrelor  90 mg Oral BID   Continuous Infusions: . sodium chloride 50 mL/hr at 06/10/14 2026  . nitroGLYCERIN 5 mcg/min (06/11/14 1309)   PRN Meds:.acetaminophen, ipratropium-albuterol, nitroGLYCERIN, ondansetron (ZOFRAN) IV  Assessment/Plan: Small NSTEMI Non-sustain VT in absence of hypokalemia and hypomagensemia Native vessel multi-vesel CAD S/P Stent in Left circumflex H/O Stent(multiple) in LAD Hypertension COPD Obesity  Increase Coreg. Increase activity.   LOS: 2 days    Orpah CobbAjay Tayshon Winker  MD  06/12/2014, 1:53 PM

## 2014-10-06 ENCOUNTER — Emergency Department (HOSPITAL_COMMUNITY)
Admission: EM | Admit: 2014-10-06 | Discharge: 2014-10-06 | Disposition: A | Discharge status: Home / Self Care | Payer: Medicare Other | Attending: Emergency Medicine | Admitting: Emergency Medicine

## 2014-10-06 ENCOUNTER — Emergency Department (HOSPITAL_COMMUNITY): Payer: Medicare Other

## 2014-10-06 ENCOUNTER — Encounter (HOSPITAL_COMMUNITY): Payer: Self-pay | Admitting: *Deleted

## 2014-10-06 DIAGNOSIS — Z7982 Long term (current) use of aspirin: Secondary | ICD-10-CM | POA: Diagnosis not present

## 2014-10-06 DIAGNOSIS — Z9889 Other specified postprocedural states: Secondary | ICD-10-CM | POA: Insufficient documentation

## 2014-10-06 DIAGNOSIS — I509 Heart failure, unspecified: Secondary | ICD-10-CM | POA: Diagnosis not present

## 2014-10-06 DIAGNOSIS — J441 Chronic obstructive pulmonary disease with (acute) exacerbation: Secondary | ICD-10-CM | POA: Diagnosis not present

## 2014-10-06 DIAGNOSIS — I2089 Other forms of angina pectoris: Secondary | ICD-10-CM

## 2014-10-06 DIAGNOSIS — Z8673 Personal history of transient ischemic attack (TIA), and cerebral infarction without residual deficits: Secondary | ICD-10-CM | POA: Diagnosis not present

## 2014-10-06 DIAGNOSIS — Z720 Tobacco use: Secondary | ICD-10-CM | POA: Diagnosis not present

## 2014-10-06 DIAGNOSIS — I25110 Atherosclerotic heart disease of native coronary artery with unstable angina pectoris: Secondary | ICD-10-CM | POA: Diagnosis not present

## 2014-10-06 DIAGNOSIS — Z79899 Other long term (current) drug therapy: Secondary | ICD-10-CM | POA: Diagnosis not present

## 2014-10-06 DIAGNOSIS — I252 Old myocardial infarction: Secondary | ICD-10-CM | POA: Insufficient documentation

## 2014-10-06 DIAGNOSIS — R079 Chest pain, unspecified: Secondary | ICD-10-CM | POA: Diagnosis present

## 2014-10-06 DIAGNOSIS — Z955 Presence of coronary angioplasty implant and graft: Secondary | ICD-10-CM | POA: Insufficient documentation

## 2014-10-06 DIAGNOSIS — I208 Other forms of angina pectoris: Secondary | ICD-10-CM

## 2014-10-06 LAB — I-STAT TROPONIN, ED: Troponin i, poc: 0 ng/mL (ref 0.00–0.08)

## 2014-10-06 LAB — BASIC METABOLIC PANEL
Anion gap: 10 (ref 5–15)
BUN: 15 mg/dL (ref 6–20)
CO2: 27 mmol/L (ref 22–32)
CREATININE: 1.26 mg/dL — AB (ref 0.61–1.24)
Calcium: 9.1 mg/dL (ref 8.9–10.3)
Chloride: 97 mmol/L — ABNORMAL LOW (ref 101–111)
GFR calc Af Amer: >60 mL/min (ref >60)
GFR calc non Af Amer: 59 mL/min — ABNORMAL LOW (ref >60)
Glucose, Bld: 133 mg/dL — ABNORMAL HIGH (ref 65–99)
Potassium: 3.8 mmol/L (ref 3.5–5.1)
SODIUM: 134 mmol/L — AB (ref 135–145)

## 2014-10-06 LAB — CBC
HCT: 42.3 % (ref 39.0–52.0)
Hemoglobin: 14.3 g/dL (ref 13.0–17.0)
MCH: 31.1 pg (ref 26.0–34.0)
MCHC: 33.8 g/dL (ref 30.0–36.0)
MCV: 92 fL (ref 78.0–100.0)
Platelets: 398 10*3/uL (ref 150–400)
RBC: 4.6 MIL/uL (ref 4.22–5.81)
RDW: 13.4 % (ref 11.5–15.5)
WBC: 12.3 10*3/uL — ABNORMAL HIGH (ref 4.0–10.5)

## 2014-10-06 LAB — BRAIN NATRIURETIC PEPTIDE: B Natriuretic Peptide: 58.8 pg/mL (ref 0.0–100.0)

## 2014-10-06 LAB — TROPONIN I

## 2014-10-06 MED ORDER — PREDNISONE 20 MG PO TABS
60.0000 mg | ORAL_TABLET | Freq: Once | ORAL | Status: AC
  Administered 2014-10-06: 60 mg via ORAL
  Filled 2014-10-06: qty 3

## 2014-10-06 MED ORDER — IPRATROPIUM-ALBUTEROL 0.5-2.5 (3) MG/3ML IN SOLN
3.0000 mL | Freq: Once | RESPIRATORY_TRACT | Status: AC
  Administered 2014-10-06: 3 mL via RESPIRATORY_TRACT
  Filled 2014-10-06: qty 3

## 2014-10-06 MED ORDER — ALBUTEROL SULFATE HFA 108 (90 BASE) MCG/ACT IN AERS
1.0000 | INHALATION_SPRAY | Freq: Four times a day (QID) | RESPIRATORY_TRACT | Status: DC | PRN
  Administered 2014-10-06: 1 via RESPIRATORY_TRACT
  Filled 2014-10-06: qty 6.7

## 2014-10-06 NOTE — ED Notes (Addendum)
Pt c/o increasing chest pain, dyspnea on exertion and supine position and dizziness since he had stents placed in February.   [place],  But sees Dr Algie CofferKadakia here.  He called Dr Algie CofferKadakia and he states the call wasn't returned, so he came to ED so Dr Algie CofferKadakia can come to him. Pt has nitro patch on L, states relief of L arm pain with that.

## 2014-10-06 NOTE — Discharge Instructions (Signed)
Angina Pectoris Angina pectoris is extreme discomfort in your chest, neck, or arm. Your doctor may call it just angina. It is caused by a lack of oxygen to your heart wall. It may feel like tightness or heavy pressure. It may feel like a crushing or squeezing pain. Some people say it feels like gas. It may go down your shoulders, back, and arms. Some people have symptoms other than pain. These include:  Tiredness.  Shortness of breath.  Cold sweats.  Feeling sick to your stomach (nausea). There are four types of angina:  Stable angina. This type often lasts the same amount of time each time it happens. Activity, stress, or excitement can bring it on. It often gets better after taking a medicine called nitroglycerin. This goes under your tongue.  Unstable angina. This type can happen when you are not active or even during sleep. It can suddenly get worse or happen more often. It may not get better after taking the special medicine. It can last up to 30 minutes.  Microvascular angina. This type is more common in women. It may be more severe or last longer than other types.  Prinzmetal angina. This type often happens when you are not active or in the early morning hours. HOME CARE   Only take medicines as told by your doctor.  Stay active or exercise more as told by your doctor.  Limit very hard activity as told by your doctor.  Limit heavy lifting as told by your doctor.  Keep a healthy weight.  Learn about and eat foods that are healthy for your heart.  Do not use any tobacco such as cigarettes, chewing tobacco, or e-cigarettes. GET HELP RIGHT AWAY IF:   You have chest, neck, deep shoulder, or arm pain or discomfort that lasts more than a few minutes.  You have chest, neck, deep shoulder, or arm pain or discomfort that goes away and comes back over and over again.  You have heavy sweating that seems to happen for no reason.  You have shortness of breath or trouble  breathing.  Your angina does not get better after a few minutes of rest.  Your angina does not get better after you take nitroglycerin medicine. These can all be symptoms of a heart attack. Get help right away. Call your local emergency service (911 in [place] Do not  drive yourself to the hospital. Do not  wait to for your symptoms to go away. MAKE SURE YOU:   Understand these instructions.  Will watch your condition.  Will get help right away if you are not doing well or get worse. Document Released: 10/06/2007 Document Revised: 04/24/2013 Document Reviewed: 08/21/2013 [facility] Patient Information 2015 [place], [place]. This information is not intended to replace advice given to you by your health care provider. Make sure you discuss any questions you have with your health care provider.  Chronic Obstructive Pulmonary Disease Exacerbation Chronic obstructive pulmonary disease (COPD) is a common lung condition in which airflow from the lungs is limited. COPD is a general term that can be used to describe many different lung problems that limit airflow, including chronic bronchitis and emphysema. COPD exacerbations are episodes when breathing symptoms become much worse and require extra treatment. Without treatment, COPD exacerbations can be life threatening, and frequent COPD exacerbations can cause further damage to your lungs. CAUSES   Respiratory infections.   Exposure to smoke.   Exposure to air pollution, chemical fumes, or dust. Sometimes there is no apparent cause  or trigger. RISK FACTORS  Smoking cigarettes.  Older age.  Frequent prior COPD exacerbations. SIGNS AND SYMPTOMS   Increased coughing.   Increased thick spit (sputum) production.   Increased wheezing.   Increased shortness of breath.   Rapid breathing.   Chest tightness. DIAGNOSIS  Your medical history, a physical exam, and tests will help your health care provider make a diagnosis. Tests may  include:  A chest X-ray.  Basic lab tests.  Sputum testing.  An arterial blood gas test. TREATMENT  Depending on the severity of your COPD exacerbation, you may need to be admitted to a hospital for treatment. Some of the treatments commonly used to treat COPD exacerbations are:   Antibiotic medicines.   Bronchodilators. These are drugs that expand the air passages. They may be given with an inhaler or nebulizer. Spacer devices may be needed to help improve drug delivery.  Corticosteroid medicines.  Supplemental oxygen therapy.  HOME CARE INSTRUCTIONS   Do not smoke. Quitting smoking is very important to prevent COPD from getting worse and exacerbations from happening as often.  Avoid exposure to all substances that irritate the airway, especially to tobacco smoke.   If you were prescribed an antibiotic medicine, finish it all even if you start to feel better.  Take all medicines as directed by your health care provider.It is important to use correct technique with inhaled medicines.  Drink enough fluids to keep your urine clear or pale yellow (unless you have a medical condition that requires fluid restriction).  Use a cool mist vaporizer. This makes it easier to clear your chest when you cough.   If you have a home nebulizer and oxygen, continue to use them as directed.   Maintain all necessary vaccinations to prevent infections.   Exercise regularly.   Eat a healthy diet.   Keep all follow-up appointments as directed by your health care provider. SEEK IMMEDIATE MEDICAL CARE IF:  You have worsening shortness of breath.   You have trouble talking.   You have severe chest pain.  You have blood in your sputum.  You have a fever.  You have weakness, vomit repeatedly, or faint.   You feel confused.   You continue to get worse. MAKE SURE YOU:   Understand these instructions.  Will watch your condition.  Will get help right away if you are not  doing well or get worse. Document Released: 02/14/2007 Document Revised: 09/03/2013 Document Reviewed: 12/22/2012 [facility] Patient Information 2015 [place] [employer]. This information is not intended to replace advice given to you by your health care provider. Make sure you discuss any questions you have with your health care provider.

## 2014-10-06 NOTE — ED Provider Notes (Signed)
CSN: [id number]     Arrival date & time 10/06/14  1705 History   First MD Initiated Contact with Patient 10/06/14 1751     Chief Complaint  Patient presents with  . Chest Pain     (Consider location/radiation/quality/duration/timing/severity/associated sxs/prior Treatment) Patient is a 64 y.o. male presenting with chest pain.  Chest Pain Pain location:  L chest Pain quality: pressure   Pain radiates to:  L arm Pain radiates to the back: no   Pain severity:  Moderate Duration:  3 months Timing:  Intermittent Progression:  Waxing and waning Chronicity:  Recurrent Context: movement and at rest   Context: not breathing, no drug use, not eating, no intercourse, not lifting and no trauma   Relieved by:  Nitroglycerin Worsened by:  Exertion and movement Associated symptoms: orthopnea and shortness of breath   Associated symptoms: no abdominal pain, no altered mental status, no anorexia, no anxiety, no back pain, no claudication, no cough, no diaphoresis, no dizziness, no dysphagia, no fatigue, no fever, no headache, no nausea, no numbness, no palpitations, no syncope, not vomiting and no weakness   Risk factors: coronary artery disease, male sex and smoking   Risk factors: no prior DVT/PE     Past Medical History  Diagnosis Date  . COPD (chronic obstructive pulmonary disease)   . CHF (congestive heart failure)     pt denies 05/15/2013  . Stroke   . Coronary artery disease   . Stented coronary artery     x3  . Smoking   . Myocardial infarction 2006    x 3   Past Surgical History  Procedure Laterality Date  . Stents      3 stents in heart since 2005  . Appendectomy    . Left heart catheterization with coronary angiogram N/A 06/11/2014    Procedure: LEFT HEART CATHETERIZATION WITH CORONARY ANGIOGRAM;  Surgeon: Robynn Pane, MD;  Location: [facility] CATH LAB;  Service: Cardiovascular;  Laterality: N/A;   Family History  Problem Relation Age of Onset  . Brain cancer Mother   .  Congestive Heart Failure Father   . Colon cancer Neg Hx   . Congestive Heart Failure Paternal Grandfather   . Congestive Heart Failure Paternal Grandmother   . Colitis Paternal Grandmother   . Diabetes Father   . Alcoholism Father    History  Substance Use Topics  . Smoking status: Current Every Day Smoker -- 1.00 packs/day for 49 years    Types: Cigars  . Smokeless tobacco: Never Used  . Alcohol Use: No    Review of Systems  Constitutional: Negative for fever, chills, diaphoresis, appetite change and fatigue.  HENT: Negative for congestion, ear pain, facial swelling, mouth sores, sore throat and trouble swallowing.   Eyes: Negative for visual disturbance.  Respiratory: Positive for shortness of breath. Negative for cough and chest tightness.   Cardiovascular: Positive for chest pain and orthopnea. Negative for palpitations, claudication and syncope.  Gastrointestinal: Negative for nausea, vomiting, abdominal pain, diarrhea, blood in stool and anorexia.  Endocrine: Negative for cold intolerance and heat intolerance.  Genitourinary: Negative for frequency, decreased urine volume and difficulty urinating.  Musculoskeletal: Negative for back pain and neck stiffness.  Skin: Negative for rash.  Neurological: Negative for dizziness, weakness, light-headedness, numbness and headaches.  All other systems reviewed and are negative.     Allergies  Lisinopril  Home Medications   Prior to Admission medications   Medication Sig Start Date End Date Taking? Authorizing Provider  aspirin EC 81 MG tablet Take 1 tablet (81 mg total) by mouth daily. 06/12/14  Yes Orpah CobbAjay Kadakia, MD  carvedilol (COREG) 12.5 MG tablet Take 1 tablet (12.5 mg total) by mouth 2 (two) times daily with a meal. 06/12/14  Yes Orpah CobbAjay Kadakia, MD  ezetimibe (ZETIA) 10 MG tablet Take 10 mg by mouth daily.   Yes Historical Provider, MD  furosemide (LASIX) 40 MG tablet Take 0.5 tablets (20 mg total) by mouth daily. Patient  taking differently: Take 40 mg by mouth daily.  06/12/14  Yes Orpah CobbAjay Kadakia, MD  Ipratropium-Albuterol (COMBIVENT) 20-100 MCG/ACT AERS respimat Inhale 1 puff into the lungs daily as needed for shortness of breath.   Yes Historical Provider, MD  nitroGLYCERIN (NITRODUR - DOSED IN MG/24 HR) 0.4 mg/hr patch Place 0.4 mg onto the skin 2 (two) times daily. Apply to chest wall for chest pain 05/27/14  Yes Historical Provider, MD  potassium chloride (MICRO-K) 10 MEQ CR capsule Take 1 capsule (10 mEq total) by mouth every Monday, Wednesday, and Friday. 06/12/14  Yes Orpah CobbAjay Kadakia, MD  ticagrelor (BRILINTA) 90 MG TABS tablet Take 1 tablet (90 mg total) by mouth 2 (two) times daily. 06/12/14  Yes Orpah CobbAjay Kadakia, MD  lisinopril (PRINIVIL,ZESTRIL) 5 MG tablet Take 1 tablet (5 mg total) by mouth daily. 06/12/14   Orpah CobbAjay Kadakia, MD  nitroGLYCERIN (NITROSTAT) 0.4 MG SL tablet Place 0.4 mg under the tongue every 5 (five) minutes as needed for chest pain.    Historical Provider, MD  ticagrelor (BRILINTA) 90 MG TABS tablet Take 1 tablet (90 mg total) by mouth 2 (two) times daily. 06/12/14   Orpah CobbAjay Kadakia, MD   BP 114/59 mmHg  Pulse 79  Temp(Src) 98.5 F (36.9 C) (Oral)  Resp 21  Ht 5\' 10"  (1.778 m)  Wt 231 lb 8 oz (105.008 kg)  BMI 33.22 kg/m2  SpO2 93% Physical Exam  Constitutional: He is oriented to person, place, and time. He appears well-nourished. No distress.  HENT:  Head: Normocephalic and atraumatic.  Right Ear: External ear normal.  Left Ear: External ear normal.  Eyes: Pupils are equal, round, and reactive to light. Right eye exhibits no discharge. Left eye exhibits no discharge. No scleral icterus.  Neck: Normal range of motion. Neck supple.  Cardiovascular: Normal rate.  Exam reveals no gallop and no friction rub.   No murmur heard. Pulmonary/Chest: Effort normal. No stridor. No respiratory distress. He has wheezes. He has no rales. He exhibits no tenderness.  Abdominal: Soft. He exhibits no distension  and no mass. There is no tenderness. There is no rebound and no guarding.  Musculoskeletal: He exhibits no edema or tenderness.  Neurological: He is alert and oriented to person, place, and time.  Skin: Skin is warm and dry. No rash noted. He is not diaphoretic. No erythema.    ED Course  Procedures (including critical care time) Labs Review Labs Reviewed  BASIC METABOLIC PANEL - Abnormal; Notable for the following:    Sodium 134 (*)    Chloride 97 (*)    Glucose, Bld 133 (*)    Creatinine, Ser 1.26 (*)    GFR calc non Af Amer 59 (*)    All other components within normal limits  CBC - Abnormal; Notable for the following:    WBC 12.3 (*)    All other components within normal limits  BRAIN NATRIURETIC PEPTIDE  TROPONIN I  Rosezena SensorI-STAT TROPOININ, ED    Imaging Review Dg Chest 2 View  10/06/2014  CLINICAL DATA:  Acute Left-sided chest pain and left arm pain. Shortness of breath.  EXAM: CHEST  2 VIEW  COMPARISON:  11/22/2012  FINDINGS: The cardiac silhouette, mediastinal and hilar contours are within normal limits and stable. There are chronic bronchitic changes and mild hyperinflation consistent with smoking changes. No infiltrates, edema or effusions. The bony thorax is intact.  IMPRESSION: Chronic bronchitic changes, likely related to smoking. No acute overlying pulmonary process.   Electronically Signed   By: Rudie Meyer M.D.   On: 10/06/2014 18:16     EKG Interpretation   Date/Time:  Sunday October 06 2014 17:11:35 EDT Ventricular Rate:  93 PR Interval:  140 QRS Duration: 72 QT Interval:  366 QTC Calculation: 455 R Axis:   87 Text Interpretation:  Normal sinus rhythm Anteroseptal infarct , age  undetermined Abnormal ECG t wave inversions no longer present in  inferolateral leads Confirmed by Mirian Mo [id number]) on 10/06/2014  5:52:40 PM      MDM   Final diagnoses:  Angina at rest  COPD exacerbation    64 year old gentleman with a history of CAD status post multiple  stents less than which was placed in February presents today with similar recurrent chest pain since his last catheterization in February. Patient reports that the pain is relieved with the nitroglycerin however it recurs at night usually when he needs to take the patch off. Patient also endorses shortness of breath throughout that time and that has improved with DuoNeb's however he's been out of his nebulizer for about a week. Since then his shortness of breath has worsened. He denies any recent fevers or infections. No increased sputum production. Rest of the history of present illness and exam as above.  EKG with normal sinus rhythm and age indeterminate anterior septal infarct similar to prior. No acute ischemic changes or arrhythmias. Initial troponin negative. Delta troponin negative. BNP was within normal limits. CBC nonspecific. BMP with baseline renal insufficiency.   Patient given breathing treatment which resulted in complete resolution of chest pain and shortness of breath.  Presentation was consistent with COPD exacerbation and baseline angina. Doubt ACS. Low clinical suspicion for pulmonary embolism or dissection. No physical or diagnostic evidence of CHF exacerbation.  Patient safe for discharge and given strict return precautions. Patient's reported that he has a follow-up appointment with cardiology tomorrow morning. Patient instructed to keep this appointment.  Patient discharged in good condition.   Patient seen in conjunction with Dr. Littie Deeds.  Deniece Portela, MD. Resident      Drema Pry, MD 10/07/14 6045  Mirian Mo, MD 10/10/14 [phone]

## 2014-12-05 ENCOUNTER — Other Ambulatory Visit: Payer: Self-pay | Admitting: Cardiovascular Disease

## 2014-12-05 DIAGNOSIS — I209 Angina pectoris, unspecified: Secondary | ICD-10-CM

## 2014-12-06 ENCOUNTER — Encounter (HOSPITAL_COMMUNITY): Admission: RE | Admit: 2014-12-06 | Payer: Medicare Other | Source: Ambulatory Visit

## 2014-12-06 ENCOUNTER — Encounter (HOSPITAL_COMMUNITY)
Admission: RE | Admit: 2014-12-06 | Discharge: 2014-12-06 | Disposition: A | Discharge status: Home / Self Care | Payer: Medicare Other | Source: Ambulatory Visit | Attending: Cardiovascular Disease | Admitting: Cardiovascular Disease

## 2014-12-06 DIAGNOSIS — I209 Angina pectoris, unspecified: Secondary | ICD-10-CM | POA: Diagnosis present

## 2014-12-06 MED ORDER — REGADENOSON 0.4 MG/5ML IV SOLN
0.4000 mg | Freq: Once | INTRAVENOUS | Status: AC
  Administered 2014-12-06: 0.4 mg via INTRAVENOUS

## 2014-12-06 MED ORDER — REGADENOSON 0.4 MG/5ML IV SOLN
INTRAVENOUS | Status: AC
Start: 2014-12-06 — End: 2014-12-06
  Filled 2014-12-06: qty 5

## 2014-12-06 MED ORDER — TECHNETIUM TC 99M SESTAMIBI GENERIC - CARDIOLITE
30.0000 | Freq: Once | INTRAVENOUS | Status: AC | PRN
  Administered 2014-12-06: 30 via INTRAVENOUS

## 2014-12-06 MED ORDER — TECHNETIUM TC 99M SESTAMIBI GENERIC - CARDIOLITE: 10.0000 | Freq: Once | INTRAVENOUS | Status: AC | PRN

## 2015-07-07 ENCOUNTER — Other Ambulatory Visit: Payer: Self-pay | Admitting: Cardiovascular Disease

## 2015-07-07 ENCOUNTER — Ambulatory Visit
Admission: RE | Admit: 2015-07-07 | Discharge: 2015-07-07 | Disposition: A | Discharge status: Home / Self Care | Payer: Medicare Other | Source: Ambulatory Visit | Attending: Cardiovascular Disease | Admitting: Cardiovascular Disease

## 2015-07-07 DIAGNOSIS — R200 Anesthesia of skin: Secondary | ICD-10-CM

## 2015-10-06 ENCOUNTER — Other Ambulatory Visit: Payer: Self-pay | Admitting: Cardiovascular Disease

## 2015-10-06 DIAGNOSIS — I25110 Atherosclerotic heart disease of native coronary artery with unstable angina pectoris: Secondary | ICD-10-CM

## 2015-10-07 ENCOUNTER — Encounter (HOSPITAL_COMMUNITY)
Admission: RE | Admit: 2015-10-07 | Discharge: 2015-10-07 | Disposition: A | Discharge status: Home / Self Care | Payer: Medicare Other | Source: Ambulatory Visit | Attending: Cardiovascular Disease | Admitting: Cardiovascular Disease

## 2015-10-07 DIAGNOSIS — I25110 Atherosclerotic heart disease of native coronary artery with unstable angina pectoris: Secondary | ICD-10-CM

## 2015-10-07 MED ORDER — REGADENOSON 0.4 MG/5ML IV SOLN
0.4000 mg | Freq: Once | INTRAVENOUS | Status: AC
  Administered 2015-10-07: 0.4 mg via INTRAVENOUS

## 2015-10-07 MED ORDER — TECHNETIUM TC 99M TETROFOSMIN IV KIT
10.0000 | PACK | Freq: Once | INTRAVENOUS | Status: AC | PRN
  Administered 2015-10-07: 10 via INTRAVENOUS

## 2015-10-07 MED ORDER — TECHNETIUM TC 99M TETROFOSMIN IV KIT
30.0000 | PACK | Freq: Once | INTRAVENOUS | Status: AC | PRN
  Administered 2015-10-07: 30 via INTRAVENOUS

## 2015-10-07 MED ORDER — REGADENOSON 0.4 MG/5ML IV SOLN
INTRAVENOUS | Status: AC
  Filled 2015-10-07: qty 5

## 2016-06-05 ENCOUNTER — Encounter (HOSPITAL_COMMUNITY): Payer: Self-pay

## 2016-06-05 ENCOUNTER — Observation Stay (HOSPITAL_COMMUNITY)
Admission: AD | Admit: 2016-06-05 | Discharge: 2016-06-06 | Disposition: A | Discharge status: Home / Self Care | Payer: Medicare Other | Source: Ambulatory Visit | Attending: Cardiovascular Disease | Admitting: Cardiovascular Disease

## 2016-06-05 ENCOUNTER — Observation Stay (HOSPITAL_COMMUNITY): Payer: Medicare Other

## 2016-06-05 ENCOUNTER — Observation Stay (HOSPITAL_BASED_OUTPATIENT_CLINIC_OR_DEPARTMENT_OTHER): Payer: Medicare Other

## 2016-06-05 DIAGNOSIS — E669 Obesity, unspecified: Secondary | ICD-10-CM | POA: Insufficient documentation

## 2016-06-05 DIAGNOSIS — I509 Heart failure, unspecified: Secondary | ICD-10-CM | POA: Diagnosis not present

## 2016-06-05 DIAGNOSIS — Z8673 Personal history of transient ischemic attack (TIA), and cerebral infarction without residual deficits: Secondary | ICD-10-CM | POA: Insufficient documentation

## 2016-06-05 DIAGNOSIS — Z833 Family history of diabetes mellitus: Secondary | ICD-10-CM | POA: Insufficient documentation

## 2016-06-05 DIAGNOSIS — R1110 Vomiting, unspecified: Secondary | ICD-10-CM | POA: Insufficient documentation

## 2016-06-05 DIAGNOSIS — I2510 Atherosclerotic heart disease of native coronary artery without angina pectoris: Secondary | ICD-10-CM | POA: Diagnosis not present

## 2016-06-05 DIAGNOSIS — F17290 Nicotine dependence, other tobacco product, uncomplicated: Secondary | ICD-10-CM | POA: Diagnosis not present

## 2016-06-05 DIAGNOSIS — R0602 Shortness of breath: Secondary | ICD-10-CM | POA: Diagnosis present

## 2016-06-05 DIAGNOSIS — E781 Pure hyperglyceridemia: Secondary | ICD-10-CM | POA: Diagnosis not present

## 2016-06-05 DIAGNOSIS — Z8249 Family history of ischemic heart disease and other diseases of the circulatory system: Secondary | ICD-10-CM | POA: Insufficient documentation

## 2016-06-05 DIAGNOSIS — Z7982 Long term (current) use of aspirin: Secondary | ICD-10-CM | POA: Insufficient documentation

## 2016-06-05 DIAGNOSIS — R269 Unspecified abnormalities of gait and mobility: Principal | ICD-10-CM

## 2016-06-05 DIAGNOSIS — Z6833 Body mass index (BMI) 33.0-33.9, adult: Secondary | ICD-10-CM | POA: Insufficient documentation

## 2016-06-05 DIAGNOSIS — Z955 Presence of coronary angioplasty implant and graft: Secondary | ICD-10-CM | POA: Diagnosis not present

## 2016-06-05 DIAGNOSIS — I252 Old myocardial infarction: Secondary | ICD-10-CM | POA: Insufficient documentation

## 2016-06-05 DIAGNOSIS — J449 Chronic obstructive pulmonary disease, unspecified: Secondary | ICD-10-CM | POA: Diagnosis present

## 2016-06-05 HISTORY — DX: Dyspnea, unspecified: R06.00

## 2016-06-05 LAB — COMPREHENSIVE METABOLIC PANEL
ALBUMIN: 3.5 g/dL (ref 3.5–5.0)
ALT: 31 U/L (ref 17–63)
ANION GAP: 10 (ref 5–15)
AST: 32 U/L (ref 15–41)
Alkaline Phosphatase: 57 U/L (ref 38–126)
BUN: 12 mg/dL (ref 6–20)
CHLORIDE: 97 mmol/L — AB (ref 101–111)
CO2: 25 mmol/L (ref 22–32)
Calcium: 9.1 mg/dL (ref 8.9–10.3)
Creatinine, Ser: 1.12 mg/dL (ref 0.61–1.24)
GFR calc Af Amer: >60 mL/min (ref >60)
GFR calc non Af Amer: >60 mL/min (ref >60)
GLUCOSE: 96 mg/dL (ref 65–99)
POTASSIUM: 3.8 mmol/L (ref 3.5–5.1)
Sodium: 132 mmol/L — ABNORMAL LOW (ref 135–145)
Total Bilirubin: 0.2 mg/dL — ABNORMAL LOW (ref 0.3–1.2)
Total Protein: 6.7 g/dL (ref 6.5–8.1)

## 2016-06-05 LAB — CBC
HCT: 45.6 % (ref 39.0–52.0)
Hemoglobin: 15.1 g/dL (ref 13.0–17.0)
MCH: 31.1 pg (ref 26.0–34.0)
MCHC: 33.1 g/dL (ref 30.0–36.0)
MCV: 93.8 fL (ref 78.0–100.0)
PLATELETS: 257 10*3/uL (ref 150–400)
RBC: 4.86 MIL/uL (ref 4.22–5.81)
RDW: 13.7 % (ref 11.5–15.5)
WBC: 12.3 10*3/uL — AB (ref 4.0–10.5)

## 2016-06-05 LAB — PROTIME-INR
INR: 0.98
Prothrombin Time: 13 seconds (ref 11.4–15.2)

## 2016-06-05 MED ORDER — IPRATROPIUM-ALBUTEROL 0.5-2.5 (3) MG/3ML IN SOLN: 3.0000 mL | Freq: Every day | RESPIRATORY_TRACT | Status: DC | PRN

## 2016-06-05 MED ORDER — STROKE: EARLY STAGES OF RECOVERY BOOK
Freq: Once | Status: DC
  Filled 2016-06-05: qty 1

## 2016-06-05 MED ORDER — SODIUM CHLORIDE 0.9 % IV SOLN: INTRAVENOUS | Status: DC

## 2016-06-05 MED ORDER — ACETAMINOPHEN 325 MG PO TABS: 650.0000 mg | ORAL_TABLET | ORAL | Status: DC | PRN

## 2016-06-05 MED ORDER — ASPIRIN EC 81 MG PO TBEC
81.0000 mg | DELAYED_RELEASE_TABLET | Freq: Every day | ORAL | Status: DC
  Administered 2016-06-06: 81 mg via ORAL
  Filled 2016-06-05 (×2): qty 1

## 2016-06-05 MED ORDER — ACETAMINOPHEN 650 MG RE SUPP: 650.0000 mg | RECTAL | Status: DC | PRN

## 2016-06-05 MED ORDER — EZETIMIBE 10 MG PO TABS: 10.0000 mg | ORAL_TABLET | Freq: Every day | ORAL | Status: DC

## 2016-06-05 MED ORDER — LORAZEPAM 2 MG/ML IJ SOLN: 1.0000 mg | Freq: Once | INTRAMUSCULAR | Status: DC

## 2016-06-05 MED ORDER — TICAGRELOR 90 MG PO TABS
90.0000 mg | ORAL_TABLET | Freq: Two times a day (BID) | ORAL | Status: DC
  Administered 2016-06-05 – 2016-06-06 (×2): 90 mg via ORAL
  Filled 2016-06-05 (×2): qty 1

## 2016-06-05 MED ORDER — POTASSIUM CHLORIDE CRYS ER 10 MEQ PO TBCR
10.0000 meq | EXTENDED_RELEASE_TABLET | Freq: Every day | ORAL | Status: DC
  Administered 2016-06-05 – 2016-06-06 (×2): 10 meq via ORAL
  Filled 2016-06-05 (×2): qty 1

## 2016-06-05 MED ORDER — FUROSEMIDE 40 MG PO TABS
40.0000 mg | ORAL_TABLET | Freq: Every day | ORAL | Status: DC
  Administered 2016-06-06: 40 mg via ORAL
  Filled 2016-06-05: qty 1

## 2016-06-05 MED ORDER — NITROGLYCERIN 0.4 MG/HR TD PT24
0.4000 mg | MEDICATED_PATCH | Freq: Every day | TRANSDERMAL | Status: DC
  Filled 2016-06-05: qty 1

## 2016-06-05 MED ORDER — NITROGLYCERIN 0.4 MG SL SUBL: 0.4000 mg | SUBLINGUAL_TABLET | SUBLINGUAL | Status: DC | PRN

## 2016-06-05 MED ORDER — ACETAMINOPHEN 160 MG/5ML PO SOLN: 650.0000 mg | ORAL | Status: DC | PRN

## 2016-06-05 MED ORDER — ENOXAPARIN SODIUM 40 MG/0.4ML ~~LOC~~ SOLN
40.0000 mg | SUBCUTANEOUS | Status: DC
  Filled 2016-06-05: qty 0.4

## 2016-06-05 MED ORDER — CARVEDILOL 12.5 MG PO TABS
12.5000 mg | ORAL_TABLET | Freq: Two times a day (BID) | ORAL | Status: DC
  Administered 2016-06-05 – 2016-06-06 (×2): 12.5 mg via ORAL
  Filled 2016-06-05 (×2): qty 1

## 2016-06-05 NOTE — Progress Notes (Signed)
Patient refused PIV access placement.  MD was notified and stated that it was okay for patient to not have IV access placed at this time.

## 2016-06-05 NOTE — Progress Notes (Signed)
Mis scored by accident.

## 2016-06-05 NOTE — Significant Event (Signed)
Rapid Response Event Note  Overview:  Called to see patient for admission NIH stroke scale.    Initial Focused Assessment: Alert and oriented. NIH level 2, with mild sensory discrepancy L arm, L leg drift and   Interventions: NIH to be performed q shift.   Plan of Care (if not transferred):  Event Summary:   at      at          Kristine LineaLackey, Telia Amundson Ann

## 2016-06-05 NOTE — H&P (Signed)
Referring Physician:  KOEN ANTILLA is an 66 y.o. male.                       Chief Complaint: Gait and coordination abnormality  HPI: 66 year old male had 20 minutes of gait abnormality and vomiting beginning of January 2018. One week later he had another episode of not able to walk and control his gaze for 2 hours and 45 minutes. He denies speech problem, incontinence, seizures or syncope.  His past medical history include CAD, COPD, S/P stent, obesity, tobacco use disorder and stroke. No work up was done but according to patient he was told he has intermittent MS by his physician in [place].    Past Medical History:  Diagnosis Date  . CHF (congestive heart failure)    pt denies 05/15/2013  . COPD (chronic obstructive pulmonary disease)   . Coronary artery disease   . Myocardial infarction (HCC) 2006   x 3  . Smoking   . Stented coronary artery    x3  . Stroke       Past Surgical History:  Procedure Laterality Date  . APPENDECTOMY    . LEFT HEART CATHETERIZATION WITH CORONARY ANGIOGRAM N/A 06/11/2014   Procedure: LEFT HEART CATHETERIZATION WITH CORONARY ANGIOGRAM;  Surgeon: Robynn Pane, MD;  Location: [facility] CATH LAB;  Service: Cardiovascular;  Laterality: N/A;  . stents     3 stents in heart since 2005    Family History  Problem Relation Age of Onset  . Brain cancer Mother   . Congestive Heart Failure Father   . Colon cancer Neg Hx   . Congestive Heart Failure Paternal Grandfather   . Congestive Heart Failure Paternal Grandmother   . Colitis Paternal Grandmother   . Diabetes Father   . Alcoholism Father    Social History:  reports that he has been smoking Cigars.  He has a 49.00 pack-year smoking history. He has never used smokeless tobacco. He reports that he does not drink alcohol or use drugs.  Allergies:  Allergies  Allergen Reactions  . Lisinopril Cough    Hypotension also    Medications Prior to Admission  Medication Sig Dispense Refill  . aspirin EC 81  MG tablet Take 1 tablet (81 mg total) by mouth daily.    . carvedilol (COREG) 12.5 MG tablet Take 1 tablet (12.5 mg total) by mouth 2 (two) times daily with a meal. 60 tablet 6  . ezetimibe (ZETIA) 10 MG tablet Take 10 mg by mouth daily.    . furosemide (LASIX) 40 MG tablet Take 0.5 tablets (20 mg total) by mouth daily. (Patient taking differently: Take 40 mg by mouth daily. ) 30 tablet   . Ipratropium-Albuterol (COMBIVENT) 20-100 MCG/ACT AERS respimat Inhale 1 puff into the lungs daily as needed for shortness of breath.    . nitroGLYCERIN (NITRODUR - DOSED IN MG/24 HR) 0.4 mg/hr patch Place 0.4 mg onto the skin 2 (two) times daily. Apply to chest wall for chest pain  0  . nitroGLYCERIN (NITROSTAT) 0.4 MG SL tablet Place 0.4 mg under the tongue every 5 (five) minutes as needed for chest pain.    . potassium chloride (MICRO-K) 10 MEQ CR capsule Take 1 capsule (10 mEq total) by mouth every Monday, Wednesday, and Friday. 15 capsule 6  . ticagrelor (BRILINTA) 90 MG TABS tablet Take 1 tablet (90 mg total) by mouth 2 (two) times daily. 60 tablet 0  . [DISCONTINUED] lisinopril (PRINIVIL,ZESTRIL)  5 MG tablet Take 1 tablet (5 mg total) by mouth daily. (Patient not taking: Reported on 12/06/2014) 30 tablet 6  . [DISCONTINUED] ticagrelor (BRILINTA) 90 MG TABS tablet Take 1 tablet (90 mg total) by mouth 2 (two) times daily. 60 tablet 11    No results found for this or any previous visit (from the past 48 hour(s)). No results found.  Review Of Systems Constitutional: No fever, chills, weight loss or gain. Eyes: No vision change, Wears glasses. No discharge or pain.. Ears: No hearing loss, No tinnitus. Respiratory: No asthma, positive COPD, pneumonias and shortness of breath. No hemoptysis. Cardiovascular: Positive chest pain, palpitation or leg edema. Gastrointestinal: Positive nausea, vomiting. No diarrhea or constipation. No GI bleed. No hepatitis. Genitourinary: No dysuria, hematuria or kidney stone. No  incontinance. Neurological: No headache, positive stroke or seizures.  Psychiatry: No psych facility admission for anxiety, depression or suicide. No detox. Skin: No rash. Musculoskeletal: Positive joint pain and fibromyalgia. No neck pain or back pain. Lymphadenopathy: No lymphadenopathy Hematology: No anemia or easy bruising.   Blood pressure 125/75, pulse 76, resp. rate 20, SpO2 99 %. There is no height or weight on file to calculate BMI. General appearance: alert, cooperative, appears stated age and no distress Head: Normocephalic, atraumatic. Eyes: Hazel eyes, pink conjunctivae/corneas clear. PERRL, EOM's intact.  Neck: no adenopathy, no carotid bruit, no JVD, supple, symmetrical, trachea midline and thyroid not enlarged. Resp: clear to auscultation bilaterally Cardio: regular rate and rhythm, S1, S2 normal, II/VI systolic murmur, no click, rub or gallop GI: soft, non-tender; bowel sounds normal; no masses,  no organomegaly Extremities: extremities normal, atraumatic, no cyanosis or edema Skin: Warm and dry. No rashes or lesions Neurologic: Alert and oriented X 3, normal strength and tone. Normal coordination and gait at present.  Assessment/Plan Abnormality of gait and mobility r/o stroke CAD COPD Obesity  Place in observation MRI and MRA brain Neuro consult if needed. Dual anti-platelets therapy.   Ricki RodriguezKADAKIA,Evianna Chandran S, MD  06/05/2016, 3:12 PM

## 2016-06-05 NOTE — Progress Notes (Signed)
VASCULAR LAB PRELIMINARY  PRELIMINARY  PRELIMINARY  PRELIMINARY  Carotid duplex completed.    Preliminary report:  1-39% ICA plaquing. Vertebral artery flow is antegrade.  Norwood Quezada, RVT 06/05/2016, 5:20 PM

## 2016-06-05 NOTE — Progress Notes (Signed)
Patient arrived to 2W room 10.  Telemetry monitor applied and CCMD notified.  Patient oriented to unit and room to include call light and phone.  Will continue to monitor. 

## 2016-06-06 ENCOUNTER — Observation Stay (HOSPITAL_COMMUNITY): Payer: Medicare Other

## 2016-06-06 DIAGNOSIS — R269 Unspecified abnormalities of gait and mobility: Secondary | ICD-10-CM | POA: Diagnosis not present

## 2016-06-06 LAB — ECHOCARDIOGRAM COMPLETE
AVLVOTPG: 4 mmHg
EERAT: 7.76
LA vol A4C: 50.8 ml
LAVOL: 50.1 mL
LAVOLIN: 22.6 mL/m2
LVEEAVG: 7.76
LVEEMED: 7.76
LVELAT: 9.25 cm/s
LVOT VTI: 19.2 cm
LVOT peak vel: 94.6 cm/s
Lateral S' vel: 15.2 cm/s
MV pk A vel: 54.8 m/s
MVPG: 2 mmHg
MVPKEVEL: 71.8 m/s
RV TAPSE: 24 mm
TDI e' lateral: 9.25
TDI e' medial: 7.72

## 2016-06-06 LAB — LIPID PANEL
CHOL/HDL RATIO: 5.5 ratio
Cholesterol: 187 mg/dL (ref 0–200)
HDL: 34 mg/dL — AB (ref >40)
LDL Cholesterol: 96 mg/dL (ref 0–99)
Triglycerides: 287 mg/dL — ABNORMAL HIGH (ref <150)
VLDL: 57 mg/dL — AB (ref 0–40)

## 2016-06-06 LAB — VAS US CAROTID
LCCADDIAS: -20 cm/s
LCCADSYS: -70 cm/s
LCCAPSYS: 101 cm/s
LEFT ECA DIAS: -9 cm/s
LEFT VERTEBRAL DIAS: -14 cm/s
LICADDIAS: -37 cm/s
LICADSYS: -92 cm/s
LICAPSYS: -86 cm/s
Left CCA prox dias: 23 cm/s
Left ICA prox dias: -25 cm/s
RCCAPSYS: 105 cm/s
RIGHT ECA DIAS: -10 cm/s
RIGHT VERTEBRAL DIAS: -11 cm/s
Right CCA prox dias: 15 cm/s
Right cca dist sys: -78 cm/s

## 2016-06-06 MED ORDER — AZITHROMYCIN 250 MG PO TABS: 250.0000 mg | ORAL_TABLET | Freq: Every day | ORAL | Status: DC

## 2016-06-06 MED ORDER — AZITHROMYCIN 500 MG PO TABS
500.0000 mg | ORAL_TABLET | Freq: Every day | ORAL | Status: AC
  Administered 2016-06-06: 500 mg via ORAL
  Filled 2016-06-06: qty 1

## 2016-06-06 MED ORDER — FUROSEMIDE 40 MG PO TABS: 40.0000 mg | ORAL_TABLET | Freq: Every day | ORAL | Status: AC

## 2016-06-06 MED ORDER — AZITHROMYCIN 250 MG PO TABS: ORAL_TABLET | ORAL | 0 refills | Status: AC

## 2016-06-06 MED ORDER — POTASSIUM CHLORIDE ER 10 MEQ PO CPCR: 10.0000 meq | ORAL_CAPSULE | Freq: Every day | ORAL | 6 refills | Status: AC

## 2016-06-06 NOTE — Progress Notes (Signed)
  Echocardiogram 2D Echocardiogram has been performed.  Delcie RochENNINGTON, Makayia Duplessis 06/06/2016, 10:20 AM

## 2016-06-06 NOTE — Evaluation (Signed)
Physical Therapy Evaluation Patient Details Name: Rickey Hutchinson MRN: [id number] DOB: 08-Oct-1950 Today's Date: 06/06/2016   History of Present Illness  66 year old male had 20 minutes of gait abnormality and vomiting beginning of January 2018. One week later he had another episode of not able to walk and control his gaze for 2 hours and 45 minutes. He denies speech problem, incontinence, seizures or syncope.  His past medical history include CAD, MI x3, cardiac stents, CHF, COPD, S/P stent, obesity, tobacco use disorder and stroke. No work up was done but according to patient he was told he has intermittent MS by [place] in [place].    Clinical Impression  Patient is functioning at independent level with all mobility and gait.  No loss of balance with high level balance activities.  No acute PT needs identified - PT will sign off.    Follow Up Recommendations No PT follow up;Supervision - Intermittent    Equipment Recommendations  None recommended by PT    Recommendations for Other Services       Precautions / Restrictions Precautions Precautions: None Restrictions Weight Bearing Restrictions: No      Mobility  Bed Mobility Overal bed mobility: Independent                Transfers Overall transfer level: Independent Equipment used: None                Ambulation/Gait Ambulation/Gait assistance: Independent Ambulation Distance (Feet): 160 Feet Assistive device: None Gait Pattern/deviations: Step-through pattern;Decreased stride length   Gait velocity interpretation: at or above normal speed for age/gender General Gait Details: Patient with good balance and gait pattern.    Stairs            Wheelchair Mobility    Modified Rankin (Stroke Patients Only) Modified Rankin (Stroke Patients Only) Pre-Morbid Rankin Score: No symptoms Modified Rankin: No symptoms     Balance Overall balance assessment: No apparent balance deficits (not formally  assessed)                       Rhomberg - Eyes Opened: 30 Rhomberg - Eyes Closed: 30 (Minimal sway) High level balance activites: Backward walking;Direction changes;Turns;Sudden stops;Head turns (Stepping over obstacles) High Level Balance Comments: No loss of balance during high level balance activities.             Pertinent Vitals/Pain Pain Assessment: Faces Faces Pain Scale: Hurts a little bit Pain Location: neck Pain Descriptors / Indicators: Sore Pain Intervention(s): Monitored during session    Home Living Family/patient expects to be discharged to:: Private residence Living Arrangements: Other relatives Available Help at Discharge: Family;Available 24 hours/day (Sister in [place], Daughter in [place])           Home Equipment: None      Prior Function Level of Independence: Independent         Comments: Psychologist, occupationalUses public transportation     Hand Dominance   Dominant Hand: Left    Extremity/Trunk Assessment   Upper Extremity Assessment Upper Extremity Assessment: Overall WFL for tasks assessed    Lower Extremity Assessment Lower Extremity Assessment: Overall WFL for tasks assessed       Communication   Communication: No difficulties  Cognition Arousal/Alertness: Awake/alert Behavior During Therapy: Impulsive;Restless Overall Cognitive Status: Within Functional Limits for tasks assessed                      General Comments  Exercises     Assessment/Plan    PT Assessment Patent does not need any further PT services  PT Problem List            PT Treatment Interventions      PT Goals (Current goals can be found in the Care Plan section)  Acute Rehab PT Goals PT Goal Formulation: All assessment and education complete, DC therapy    Frequency     Barriers to discharge        Co-evaluation               End of Session Equipment Utilized During Treatment: Gait belt Activity Tolerance: Patient tolerated  treatment well Patient left: in bed;with call bell/phone within reach (sitting EOB) Nurse Communication: Mobility status (No PT needs)    Functional Assessment Tool Used: Clinical judgement Functional Limitation: Mobility: Walking and moving around Mobility: Walking and Moving Around Current Status (U9811): 0 percent impaired, limited or restricted Mobility: Walking and Moving Around Goal Status [phone]): 0 percent impaired, limited or restricted Mobility: Walking and Moving Around Discharge Status [phone]): 0 percent impaired, limited or restricted    Time: 1131-1153 PT Time Calculation (min) (ACUTE ONLY): 22 min   Charges:   PT Evaluation $PT Eval Moderate Complexity: 1 Procedure     PT G Codes:   PT G-Codes **NOT FOR INPATIENT CLASS** Functional Assessment Tool Used: Clinical judgement Functional Limitation: Mobility: Walking and moving around Mobility: Walking and Moving Around Current Status (Z3086): 0 percent impaired, limited or restricted Mobility: Walking and Moving Around Goal Status (V7846): 0 percent impaired, limited or restricted Mobility: Walking and Moving Around Discharge Status (N6295): 0 percent impaired, limited or restricted    Vena Austria 06/06/2016, 12:32 PM Durenda Hurt. Renaldo Fiddler, [facility] [employer] Acute Rehab Services Pager [phone]

## 2016-06-06 NOTE — Discharge Summary (Signed)
Physician Discharge Summary  Patient ID: Rickey Hutchinson MRN: [id number] DOB/AGE: [birth year] 66 y.o.  Admit date: 06/05/2016 Discharge date: 06/06/2016  Admission Diagnoses: Abnormal gait and mobility CAD COPD Obesity  Discharge Diagnoses:  Principal Problem:   Abnormality of gait and mobility Active Problems:   Coronary artery disease involving native coronary artery   Shortness of breath on exertion   COPD, moderate (HCC)   Obesity   Hypertriglyceridemia   Possible early pneumonia  Discharged Condition: fair  Hospital Course: 66 year old male with 2 episodes of weakness and gait abnormality in last 1 month. His MRI/MRA of brain was unremarkable. His echocardiogram was stable with apical severe hypokinesia and EF 40-45 %. His both carotids had less than 40 % stenosis. Z-pak was started for possible early pneumonia. He was given cane for ambulation and advised to f/u with me in 1 week and 6 months and as needed.   Consults: cardiology  Significant Diagnostic Studies: labs: Good cholesterol control with low HDL and high triglycerides. Near normal CMET with low sodium of 132. Normal CBC except mildly elevated WBC count.  CXR: Mild left lung opacity.  MRA brain: Essentially unremarkable with motion degraded images.  Echocardiogram: Mild to moderate LV systolic dysfunction with apical severe hypokinesia.   Treatments: cardiac meds: Aspirin, Brilinta, carvedilol, furosemide and potassium  Discharge Exam: Blood pressure (!) 107/57, pulse 63, temperature 97.3 F (36.3 C), temperature source Oral, resp. rate 20, SpO2 98 %. General appearance: alert, cooperative and appears stated age Head: Normocephalic, atraumatic. Eyes: Hazel eyes, pink conjunctivae/corneas clear. PERRL, EOM's intact.  Neck: no adenopathy, no carotid bruit, no JVD, supple, symmetrical, trachea midline and thyroid not enlarged. Resp: clear to auscultation bilaterally. Cardio: regular rate and rhythm, S1, S2  normal, II/VI systolic murmur, click, rub or gallop GI: soft, non-tender; bowel sounds normal; no masses,  no organomegaly Extremities: extremities normal, atraumatic, no cyanosis or edema Skin: Warm and dry. No rashes or lesions Neurologic: Alert and oriented X 3, normal strength and tone. Normal coordination and slow gait.  Disposition: 01-Home or Self Care   Allergies as of 06/06/2016      Reactions   Lisinopril Cough   Hypotension also      Medication List    STOP taking these medications   ALEVE 220 MG tablet Generic drug:  naproxen sodium     TAKE these medications   aspirin EC 81 MG tablet Take 1 tablet (81 mg total) by mouth daily. What changed:  when to take this   azithromycin 250 MG tablet Commonly known as:  ZITHROMAX One daily x 4 days. Start taking on:  06/07/2016   carvedilol 12.5 MG tablet Commonly known as:  COREG Take 1 tablet (12.5 mg total) by mouth 2 (two) times daily with a meal.   furosemide 40 MG tablet Commonly known as:  LASIX Take 1 tablet (40 mg total) by mouth daily.   ibuprofen 200 MG tablet Commonly known as:  ADVIL,MOTRIN Take 400 mg by mouth every 6 (six) hours as needed for headache (pain).   Ipratropium-Albuterol 20-100 MCG/ACT Aers respimat Commonly known as:  COMBIVENT Inhale 1 puff into the lungs 2 (two) times daily.   nitroGLYCERIN 0.4 mg/hr patch Commonly known as:  NITRODUR - Dosed in mg/24 hr Place 0.4 mg onto the skin See admin instructions. Apply 1 patch to chest wall up to twice daily as needed for chest pain - remove after 12 hours   potassium chloride 10 MEQ CR capsule Commonly  known as:  MICRO-K Take 1 capsule (10 mEq total) by mouth daily. What changed:  when to take this   ticagrelor 90 MG Tabs tablet Commonly known as:  BRILINTA Take 1 tablet (90 mg total) by mouth 2 (two) times daily.            Durable Medical Equipment        Start     Ordered   06/06/16 1133  For home use only DME Cane  Once      06/06/16 1132     Follow-up Information    Almond Fitzgibbon S, MD. Schedule an appointment as soon as possible for a visit in 1 week(s).   Specialty:  Cardiology Contact information: [address] [place] [address] [phone]           Signed: Ricki Hutchinson 06/06/2016, 11:41 AM

## 2016-06-07 LAB — HEMOGLOBIN A1C
Hgb A1c MFr Bld: 5.7 % — ABNORMAL HIGH (ref 4.8–5.6)
Mean Plasma Glucose: 117 mg/dL

## 2016-08-23 IMAGING — CR DG CERVICAL SPINE COMPLETE 4+V
6 series · 6 of 6 positions shown · non-contrast
Comparison: 09/02/2011

CLINICAL DATA: Intermittent left arm numbness beginning 2 weeks
ago.

EXAM:
CERVICAL SPINE - COMPLETE 4+ VIEW

[w c-spine lat]
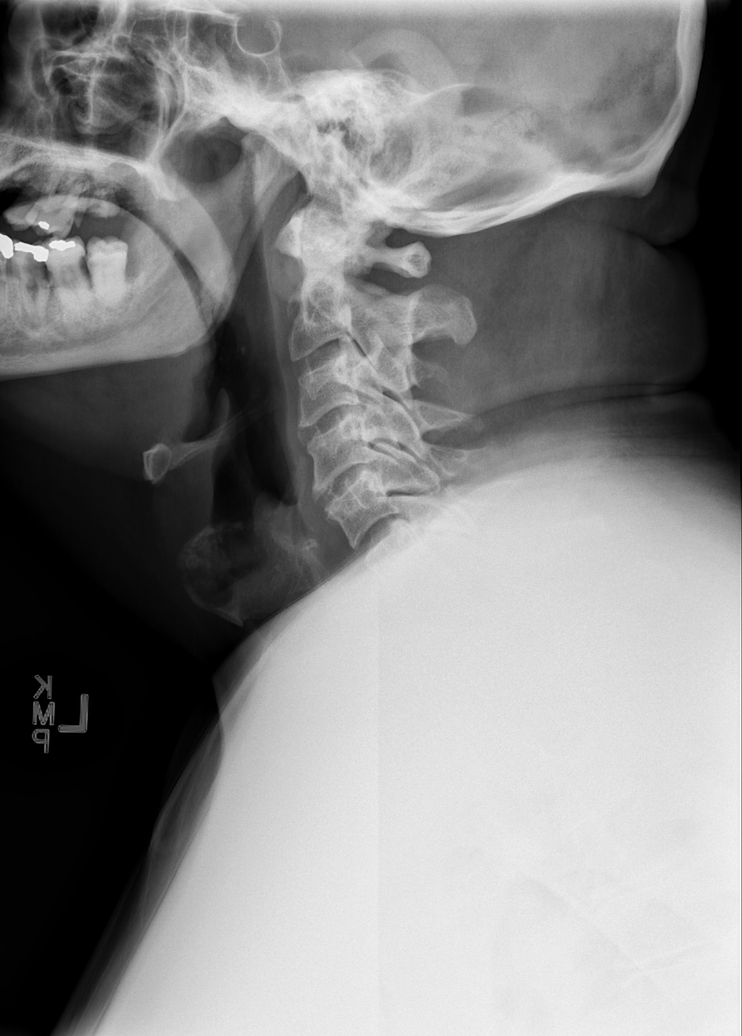

[w c-spine oblique (1 of 2)]
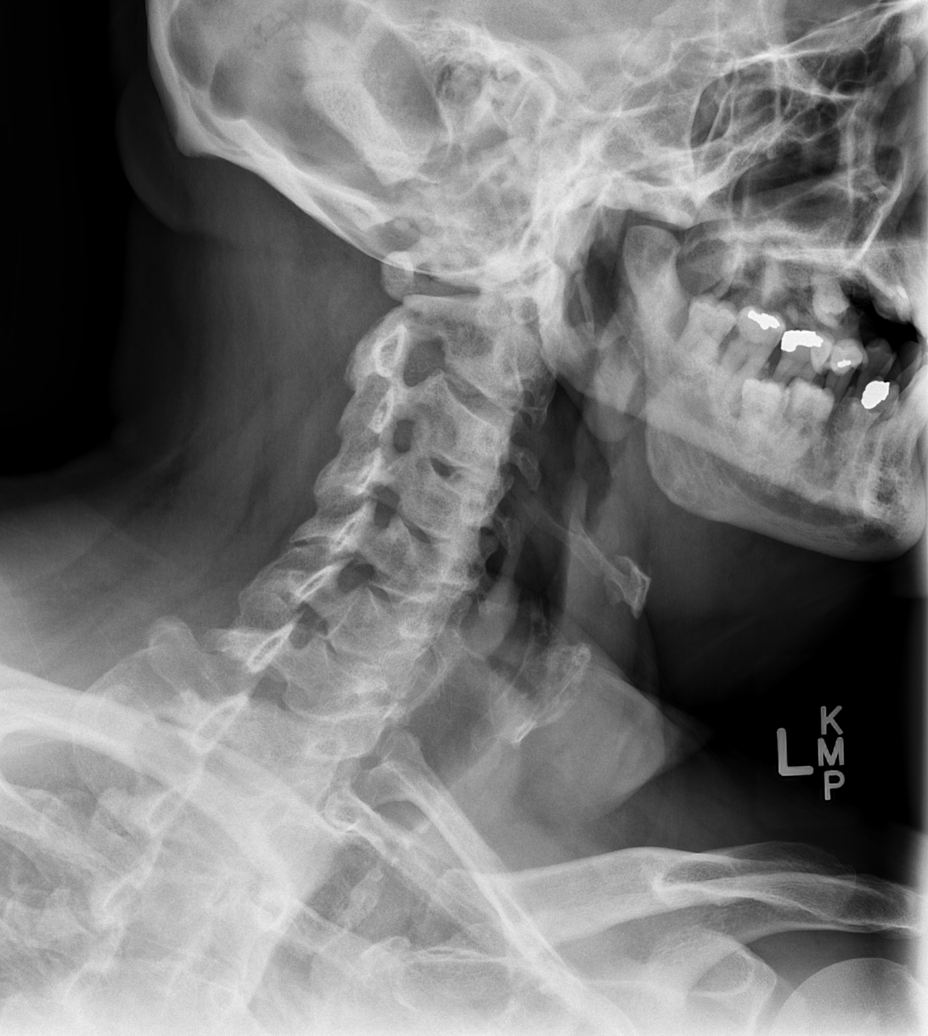

[w c-spine oblique (2 of 2)]
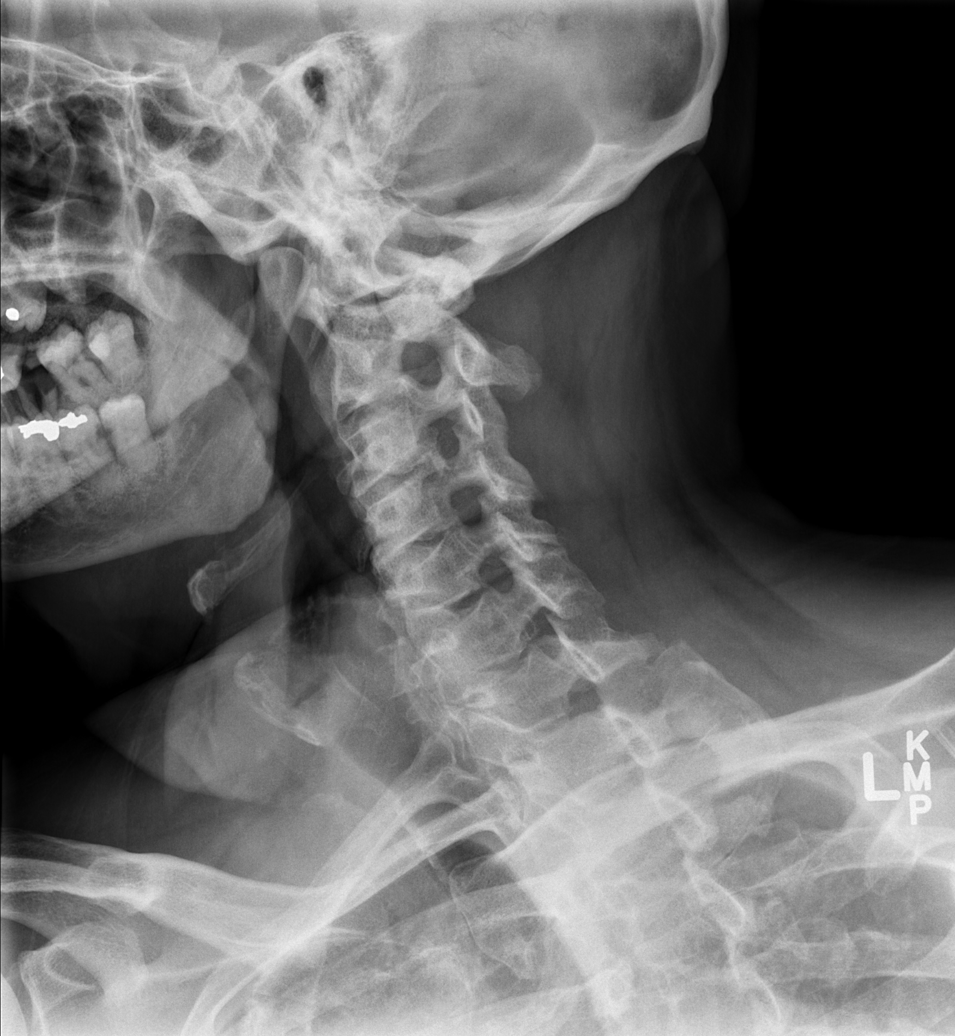

[w c-spine a.p.]
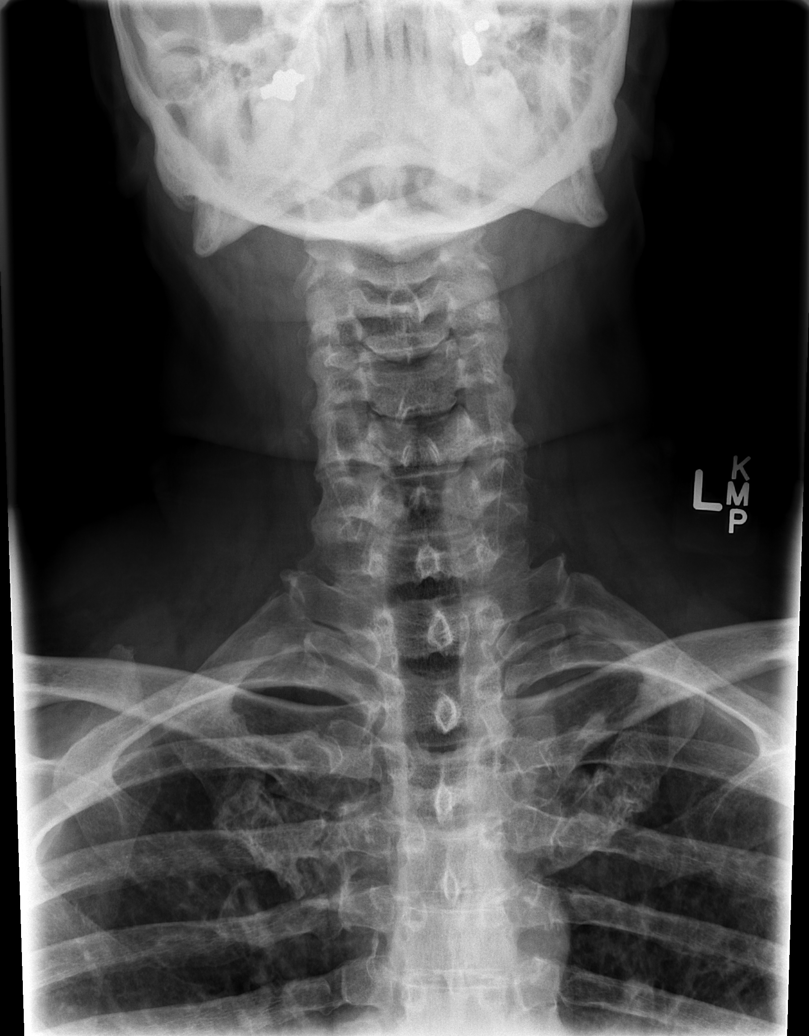

[w c-spine odontoid]
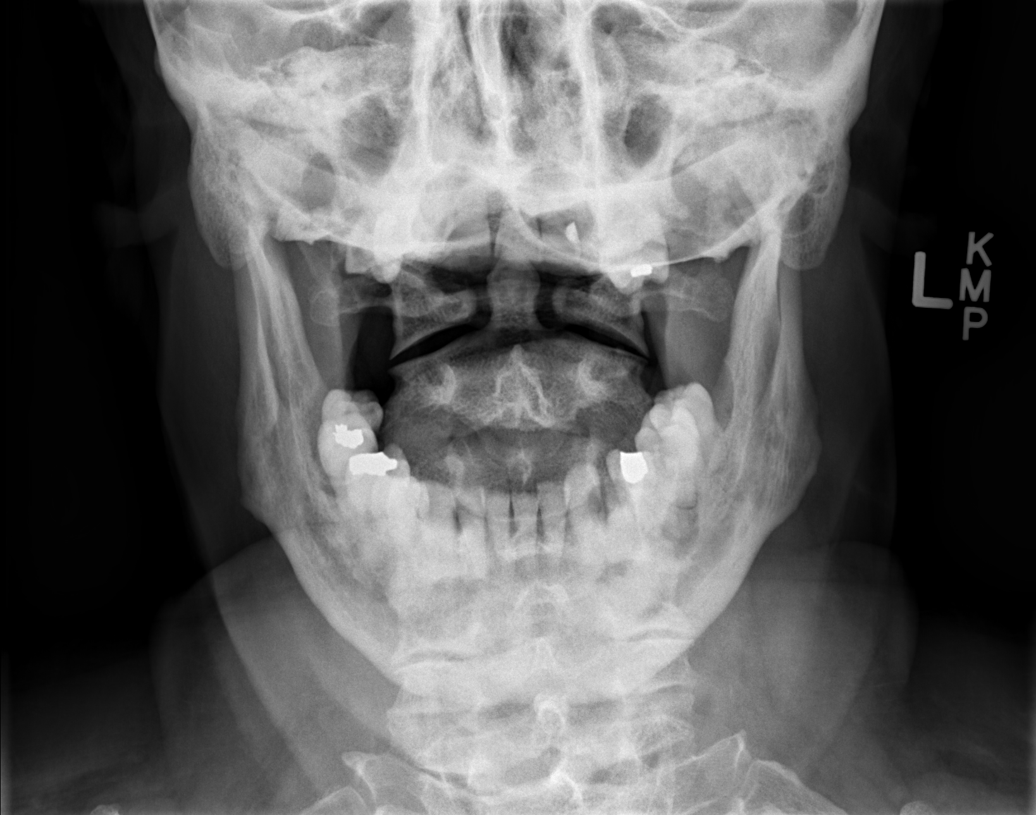

[w swimmers view]
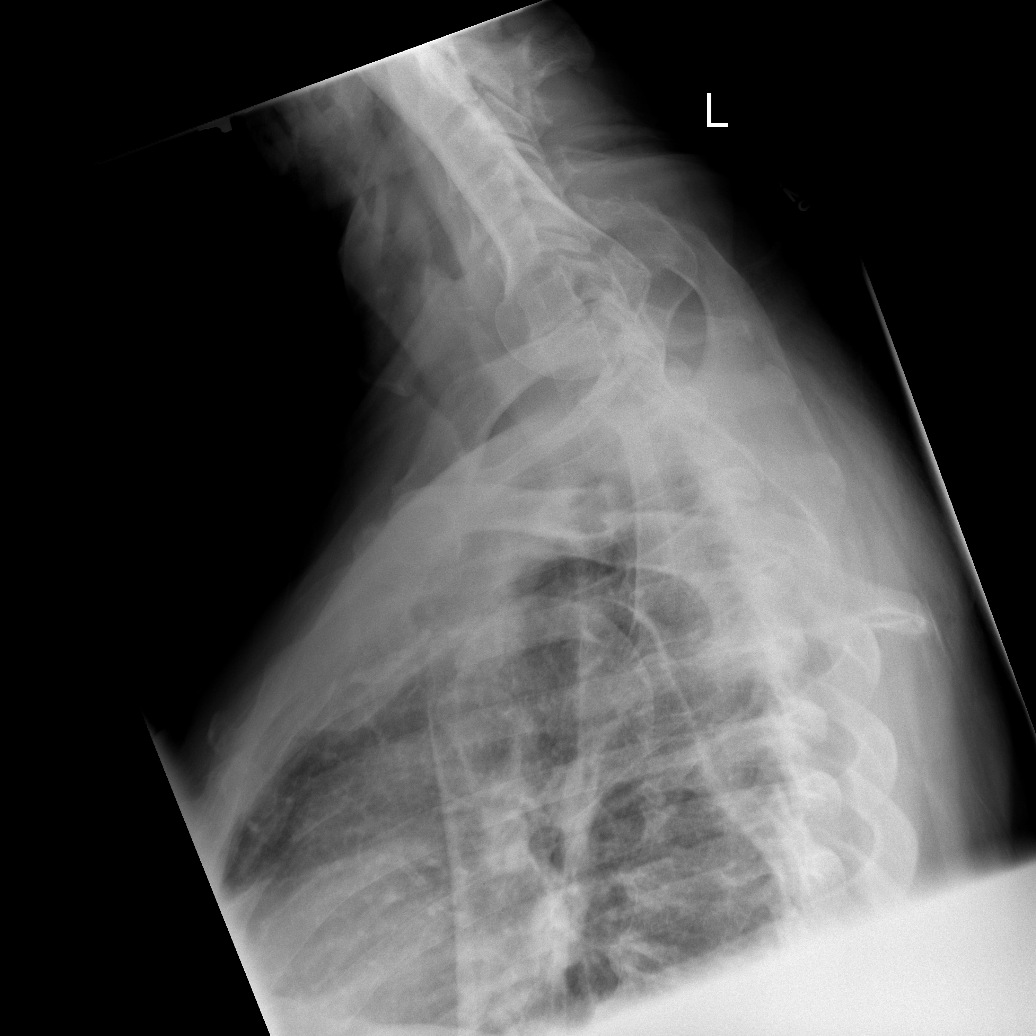

[6 of 6 positions shown; findings below may reference images not displayed]

FINDINGS: Mild degenerate spurring anteriorly at C3-4 and C4-5. Disc spaces
are maintained. Normal alignment. No fracture. No neural foraminal
narrowing.
IMPRESSION: Early spondylosis.  No acute findings.

## 2016-11-23 IMAGING — NM NM MYOCAR MULTI W/SPECT W/WALL MOTION & EF
1 series · 6 of 6 positions shown · non-contrast
Comparison: 12/06/2014

CLINICAL DATA: previous myocardial infarction and coronary
stenting. CHF

EXAM:
MYOCARDIAL IMAGING WITH SPECT (REST AND PHARMACOLOGIC-STRESS)
GATED LEFT VENTRICULAR WALL MOTION STUDY
LEFT VENTRICULAR EJECTION FRACTION
TECHNIQUE: Standard myocardial SPECT imaging was performed after resting
intravenous injection of 10 mCi Uc-66m sestamibi. Subsequently,
intravenous infusion of Lexiscan was performed under the supervision
of the Cardiology staff. At peak effect of the drug, 30 mCi Uc-66m
sestamibi was injected intravenously and standard myocardial SPECT
imaging was performed. Quantitative gated imaging was also performed
to evaluate left ventricular wall motion, and estimate left
ventricular ejection fraction.

[Series 1: rest · 8.28mm/px · 6 of 64 frames shown]
[frame 6/64]
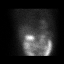
[frame 16/64]
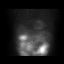
[frame 27/64]
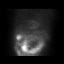
[frame 38/64]
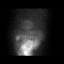
[frame 48/64]
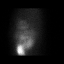
[frame 59/64]
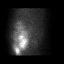

[6 of 6 positions shown; findings below may reference images not displayed]

FINDINGS: Perfusion: Large perfusion defect involving the apex extending to
anteroapical , apical septal , and inferoseptal regions of left
ventricular myocardium on resting and stress studies. No convincing
ischemia.

Wall Motion: Apical and septal hypokinesis. No left ventricular
dilation.

Left Ventricular Ejection Fraction: 47 %

End diastolic volume 137 ml

End systolic volume 73 ml
IMPRESSION: 1. Large apical/periapical and septal scar without definite
ischemia.

2. Apical and septal hypokinesis without LV dilatation.

3. Left ventricular ejection fraction 47%

4. Intermediate-risk stress test findings*.

*5155 Appropriate Use Criteria for Coronary Revascularization
Focused Update: J Am Coll Cardiol. 5155;59(9):857-881.
[URL]

## 2017-07-23 IMAGING — DX DG CHEST 2V
2 series · 2 of 2 positions shown · non-contrast
Comparison: Is October 06, 2014

CLINICAL DATA: Abnormality of gait.

EXAM:
CHEST  2 VIEW

[chest pa]
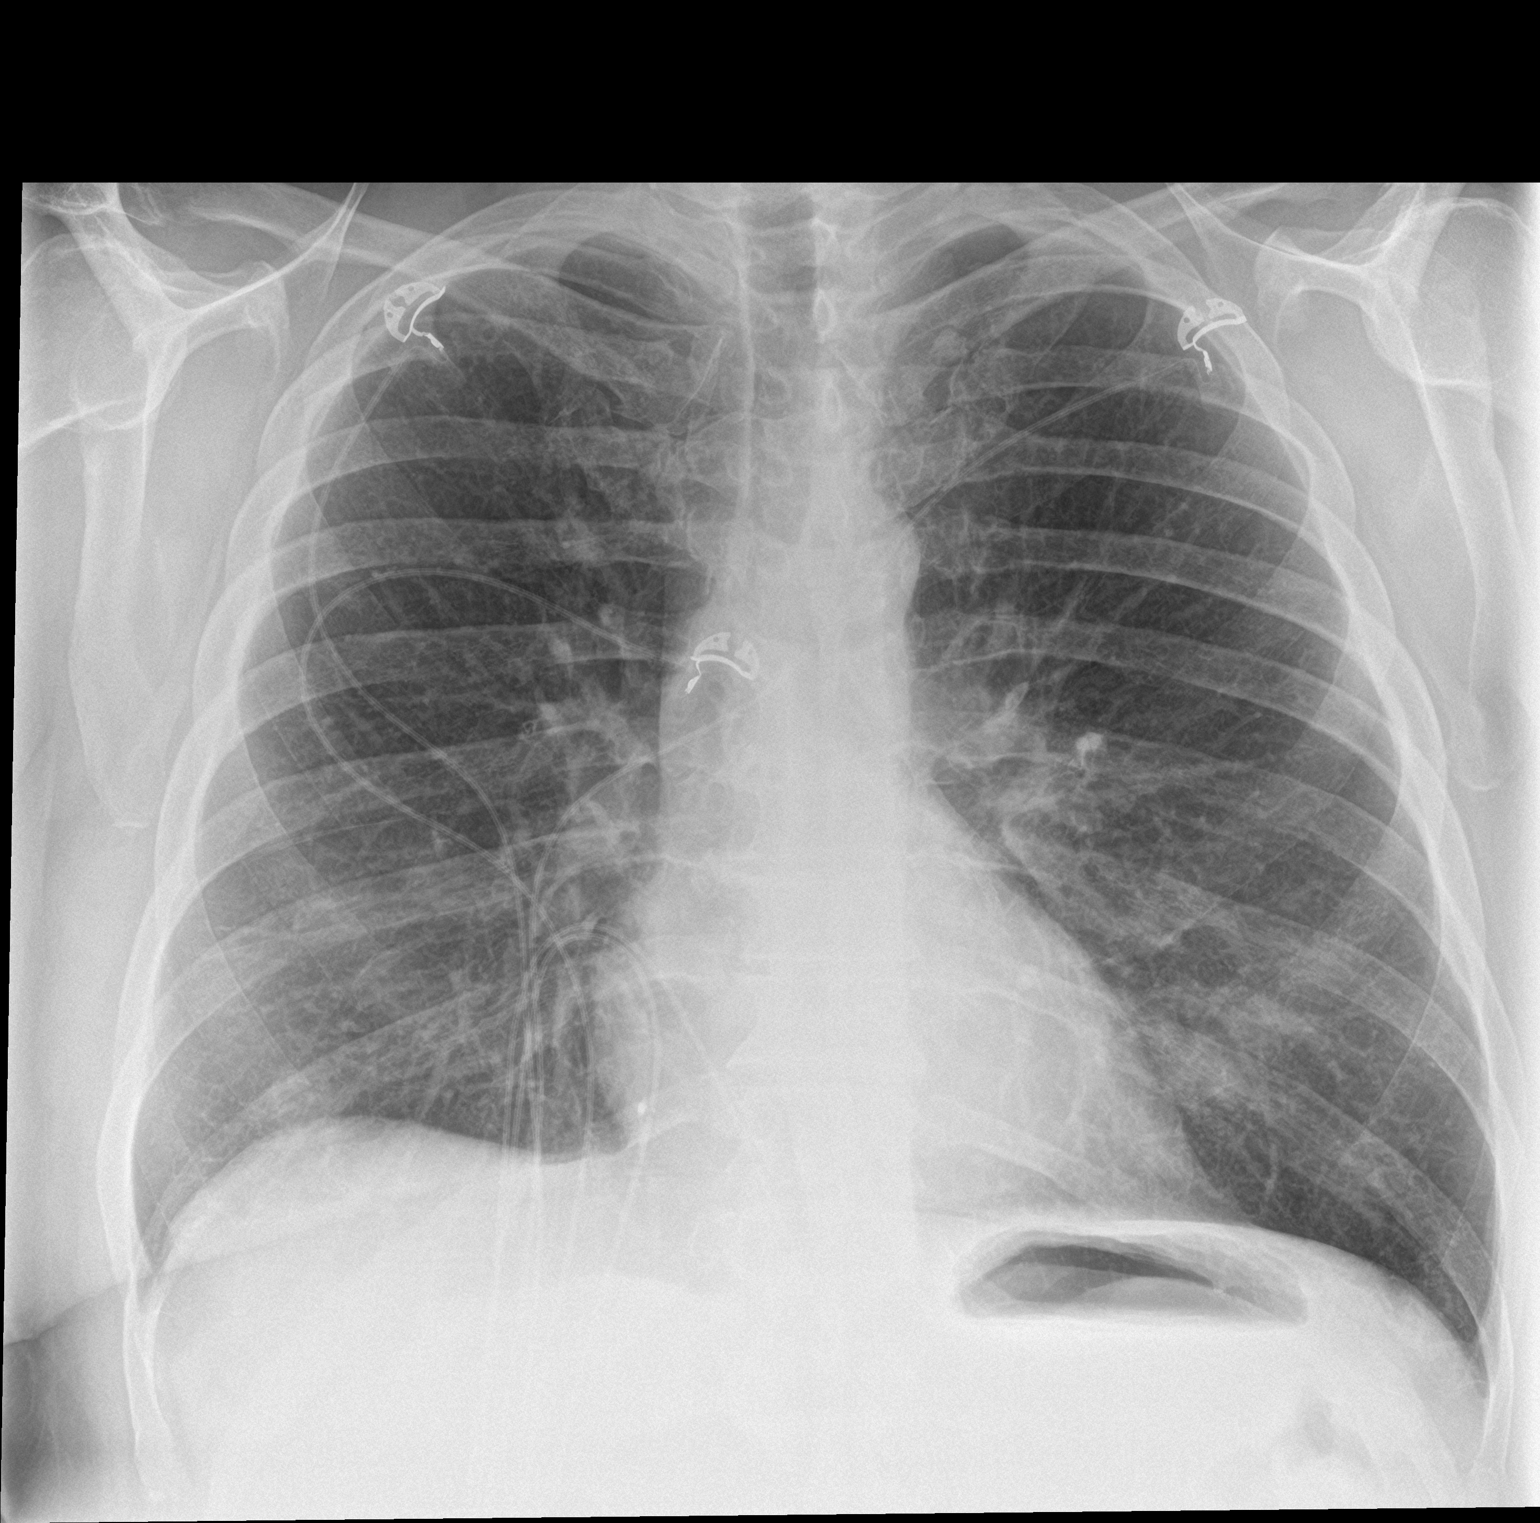

[chest lat]
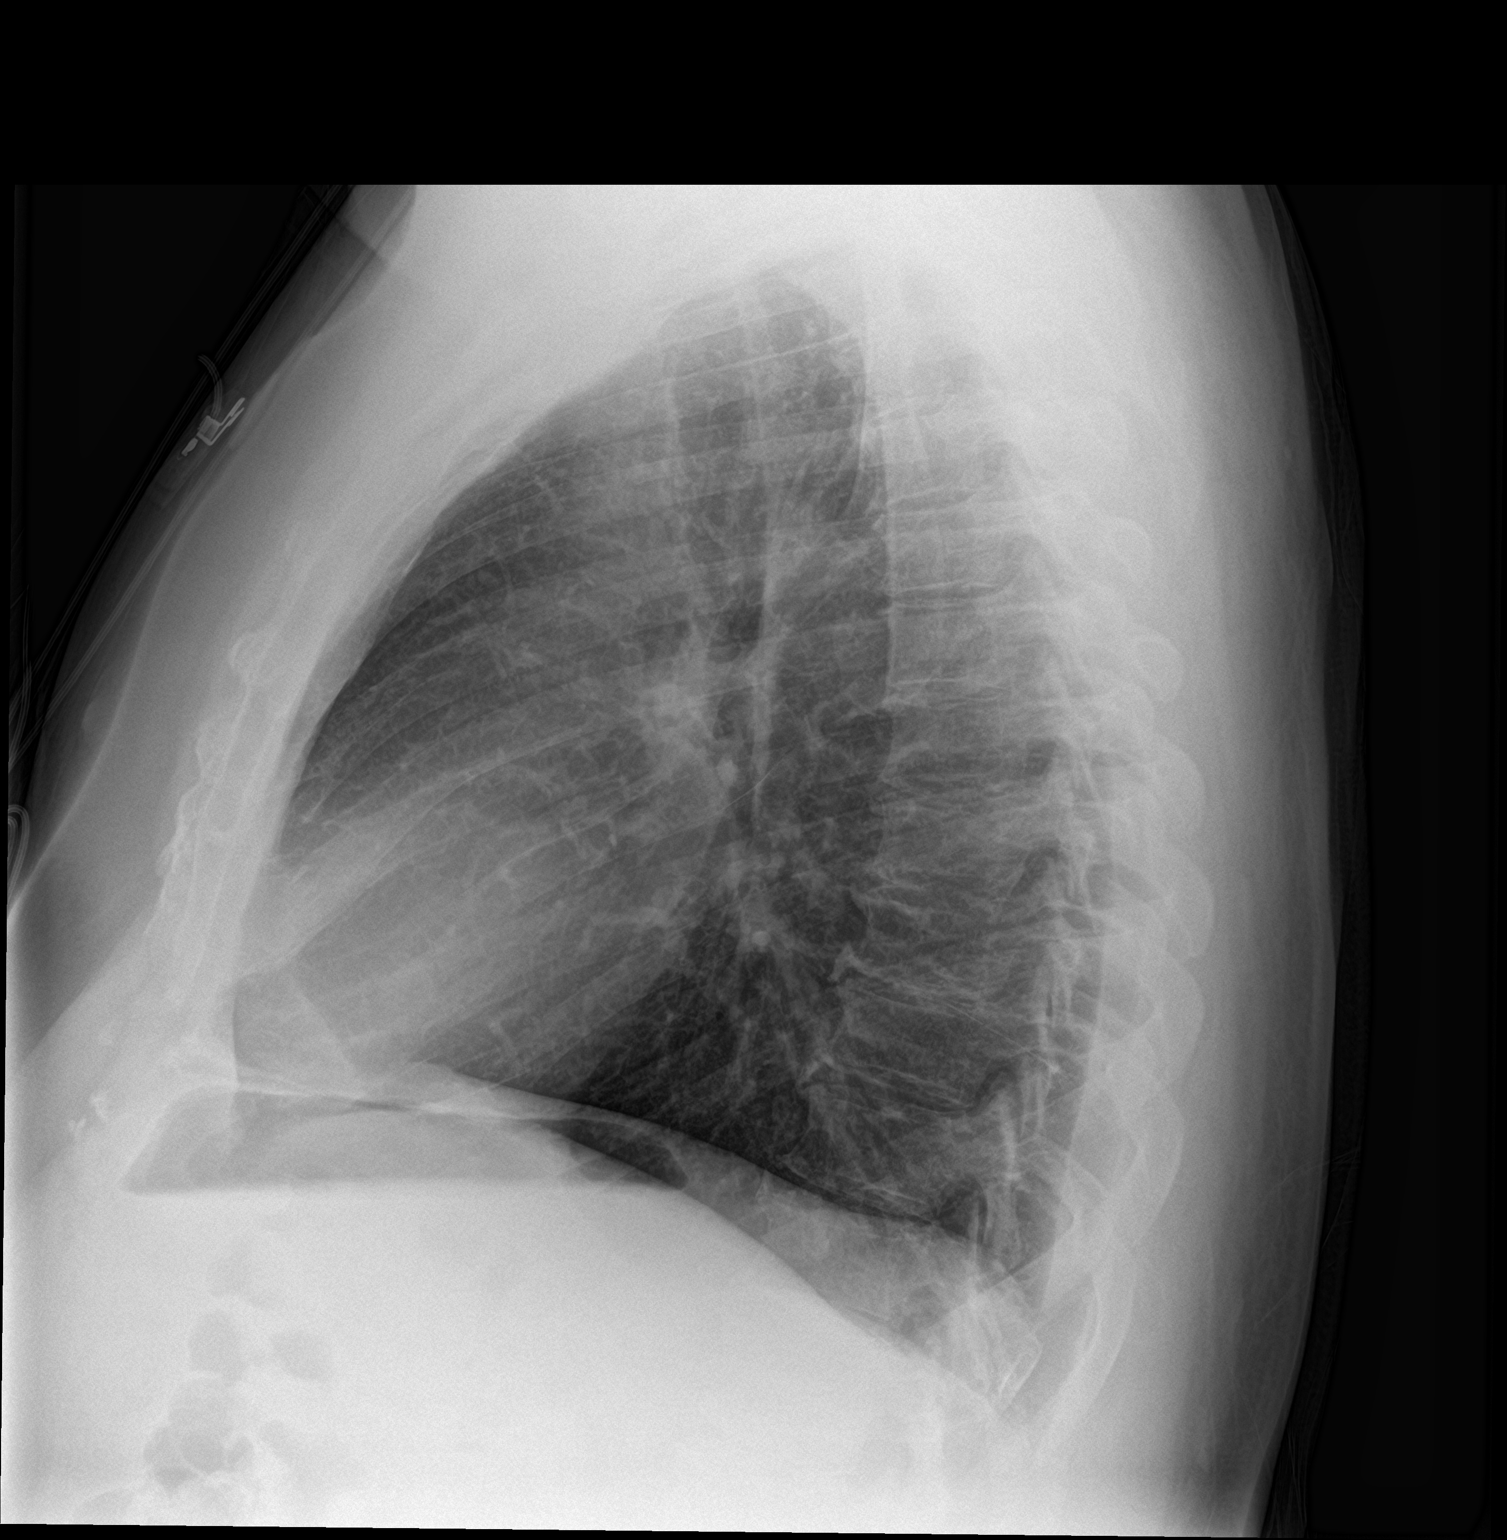

[2 of 2 positions shown; findings below may reference images not displayed]

FINDINGS: No pneumothorax. Stable cardiomediastinal silhouette. Mild opacity
in the left lung base. No other abnormalities or changes.
IMPRESSION: Mild opacity in the left lung base. Recommend follow-up to
resolution.

## 2017-07-23 IMAGING — MR MR HEAD W/O CM
9 of 12 series · 33 of 48 positions shown · non-contrast
Comparison: 09/08/2009

CLINICAL DATA: Gait abnormality and vomiting.

EXAM:
MRI HEAD WITHOUT CONTRAST
MRA HEAD WITHOUT CONTRAST
TECHNIQUE: Multiplanar, multiecho pulse sequences of the brain and surrounding
structures were obtained without intravenous contrast. Angiographic
images of the head were obtained using MRA technique without
contrast.

[Series 3: T1 · sagittal · 5.0mm · 0.47mm/px · 1 of 24 slices shown]
[im 1/24]
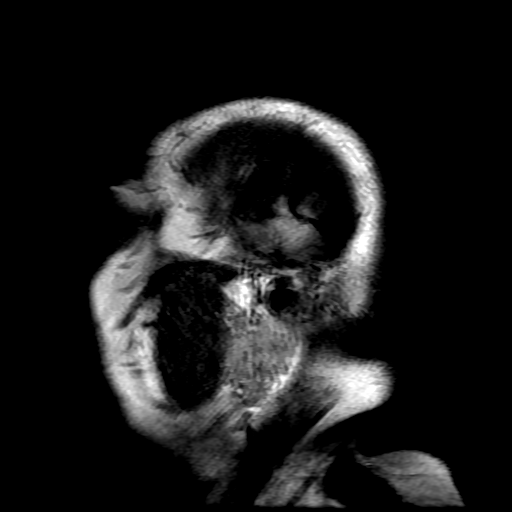

[Series 4: DWI · axial · 3.0mm · 1.09mm/px · z∈[-55,+98]mm · 7 of 103 slices shown (1 of 4)]
[im 1/103]
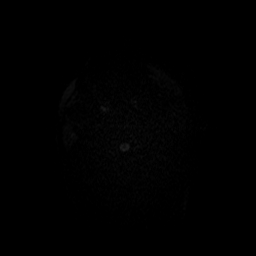
[im 18/103]
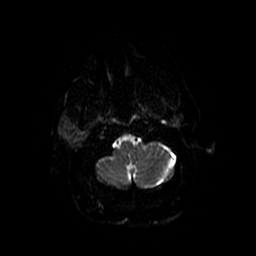
[im 35/103]
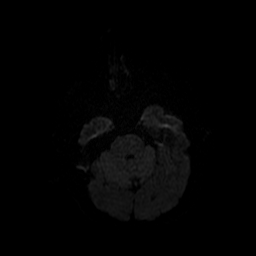
[im 52/103]
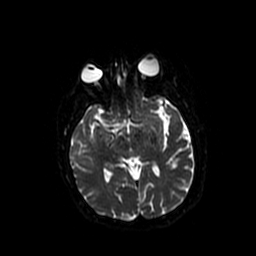
[im 69/103]
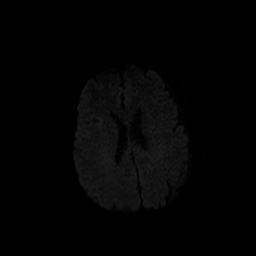
[im 86/103]
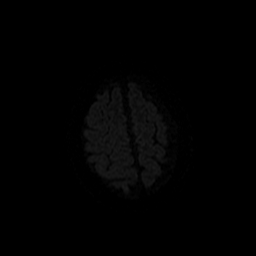
[im 103/103]
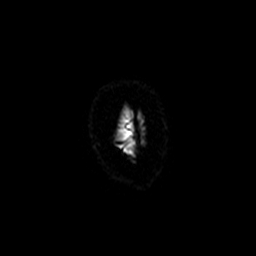

[Series 5: T2 · axial · 5.0mm · 0.43mm/px · z∈[-53,+97]mm · 2 of 26 slices shown]
[im 1/26]
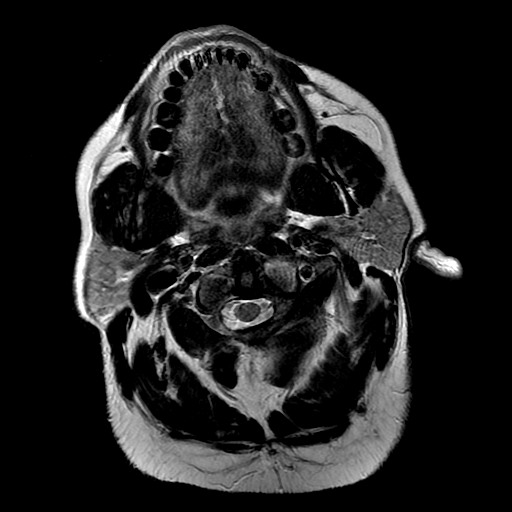
[im 26/26]
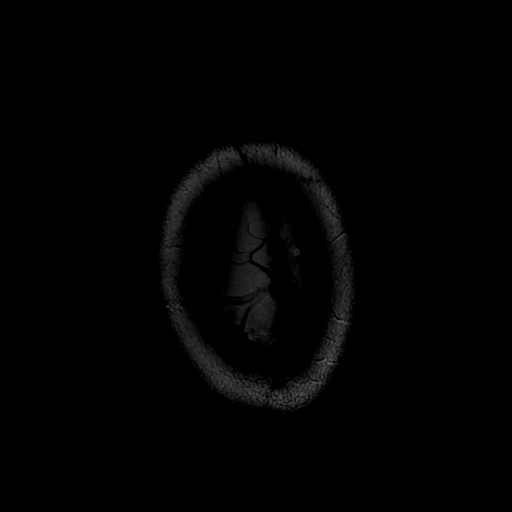

[Series 6: (id) mt fs · axial · 1.4mm · 0.43mm/px · z∈[-39,+24]mm · 6 of 136 slices shown]
[im 1/136]
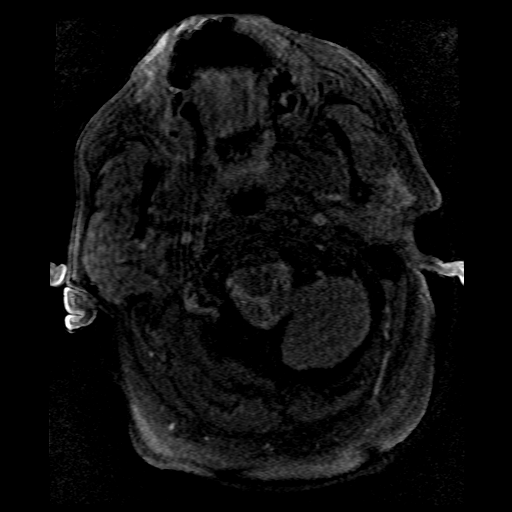
[im 16/136]
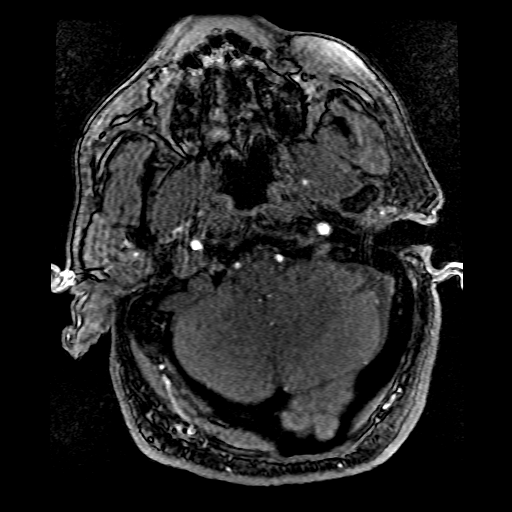
[im 46/136]
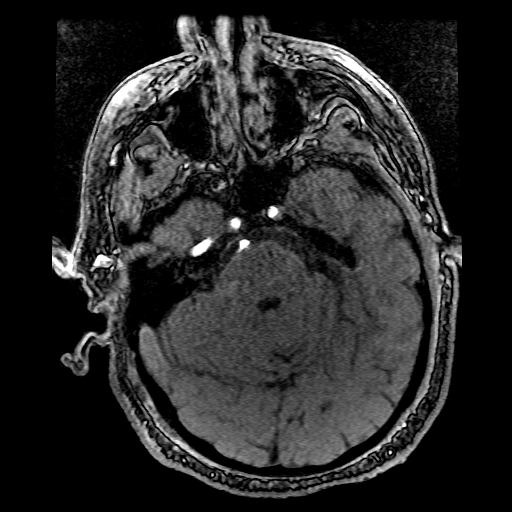
[im 61/136]
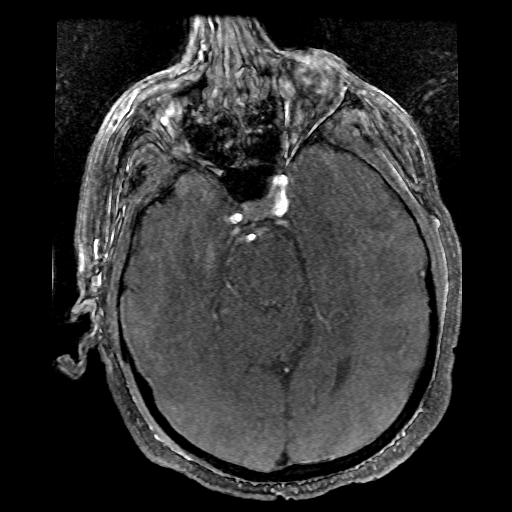
[im 76/136]
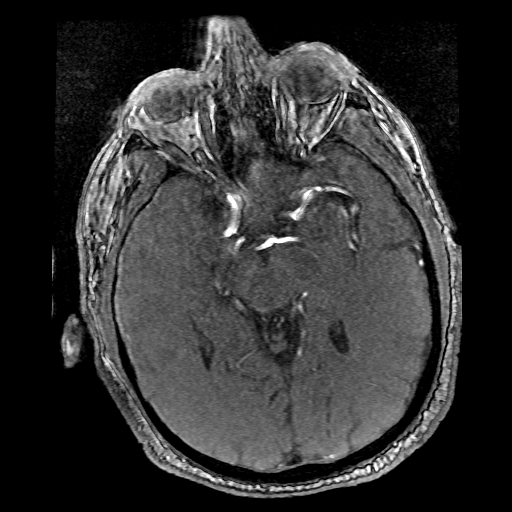
[im 91/136]
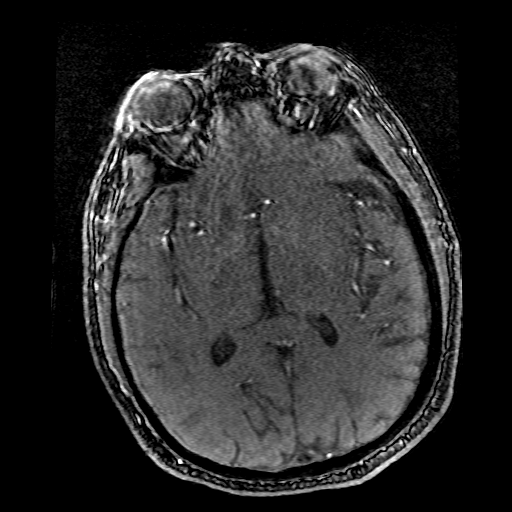

[Series 7: DWI · coronal · 5.0mm · 1.09mm/px · 6 of 74 slices shown (2 of 4)]
[im 1/74]
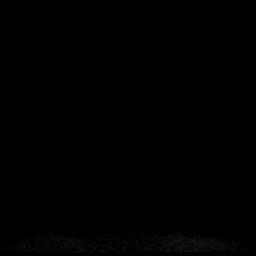
[im 15/74]
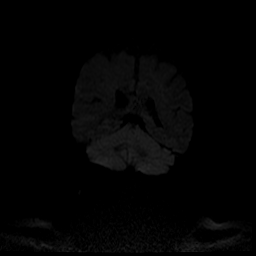
[im 30/74]
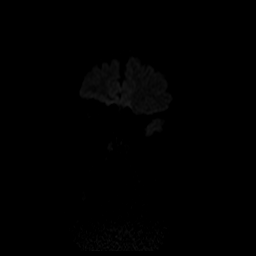
[im 44/74]
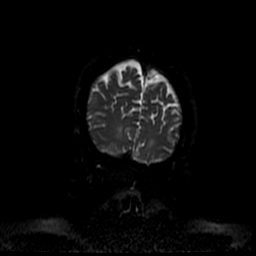
[im 59/74]
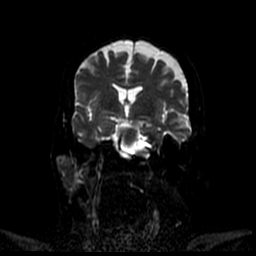
[im 74/74]
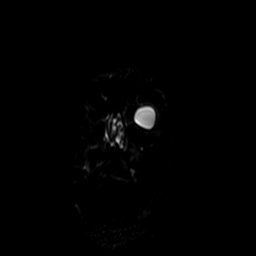

[Series 8: FLAIR · axial · 5.0mm · 0.43mm/px · z∈[-57,+93]mm · 2 of 26 slices shown]
[im 1/26]
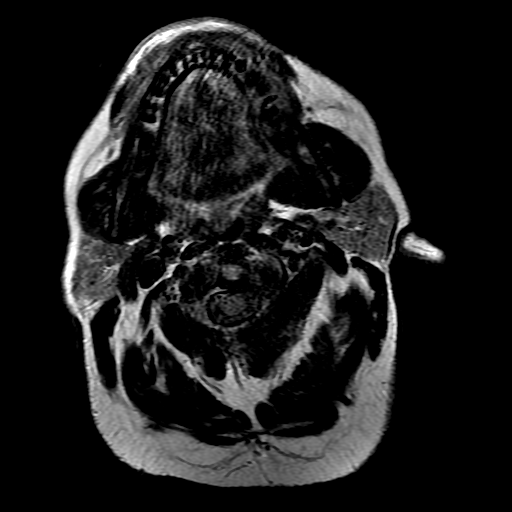
[im 26/26]
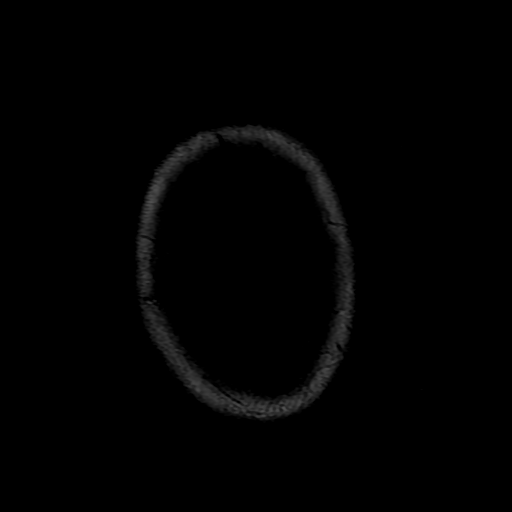

[Series 13: T2 post-contrast · coronal · 5.0mm · 0.39mm/px · 2 of 28 slices shown]
[im 1/28]
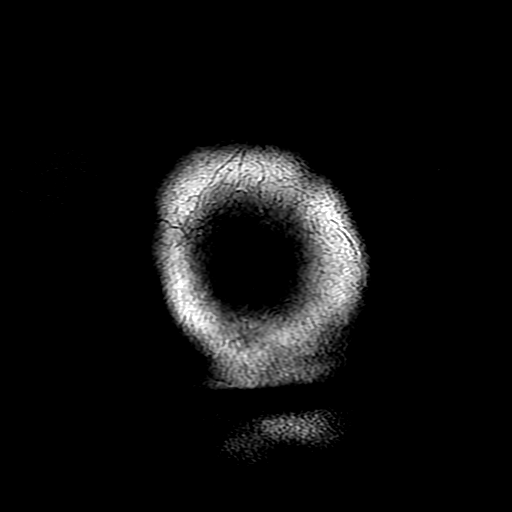
[im 28/28]
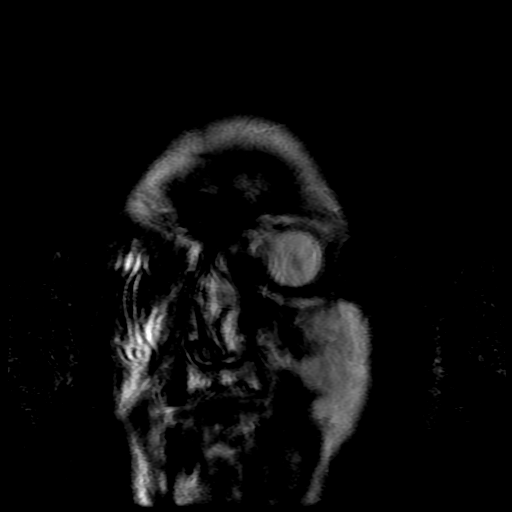

[Series 400: DWI · axial · 3.0mm · 1.09mm/px · z∈[-55,+98]mm · 4 of 52 slices shown (3 of 4)]
[im 1/52]
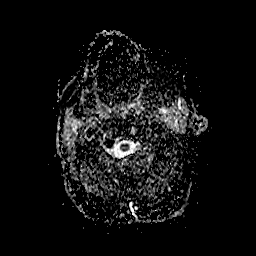
[im 18/52]
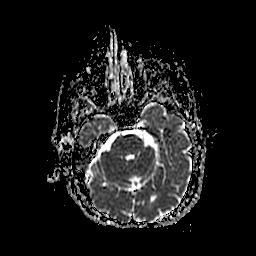
[im 35/52]
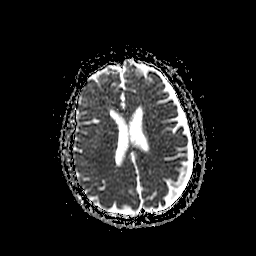
[im 52/52]
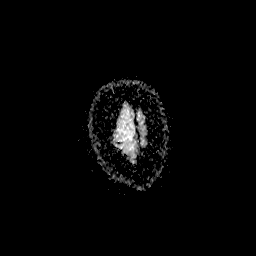

[Series 700: DWI · coronal · 5.0mm · 1.09mm/px · 3 of 37 slices shown (4 of 4)]
[im 1/37]
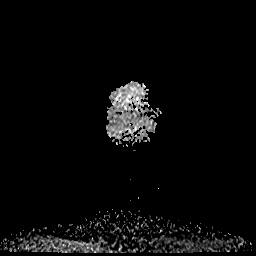
[im 19/37]
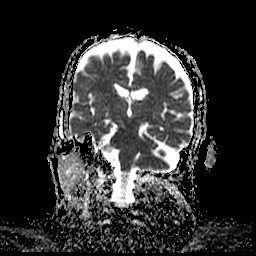
[im 37/37]
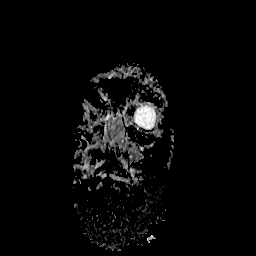

[33 of 48 positions shown; findings below may reference images not displayed]

FINDINGS: MRI HEAD FINDINGS

Brain: No acute infarction, hemorrhage, hydrocephalus, or mass
lesion.

Extra-axial CSF prominence around the left cerebral convexity and in
the interhemispheric fissure. Superficial vessels are not displaced
to suggest an extra-axial collection. The same appearance was seen
in 6844. No true hygroma suspected. No atrophy or unexpected white
matter signal.

No abnormal intracranial enhancement.

Vascular: Arterial findings below.  Normal dural venous sinuses.

Skull and upper cervical spine: Negative

Sinuses/Orbits: Negative

MRA HEAD FINDINGS

Motion degraded, which could obscure subtle findings.

Symmetric carotid arteries.  Hypoplastic right A1 segment.

No major branch occlusion or flow limiting stenosis. No evidence of
aneurysm.
IMPRESSION: Negative motion degraded brain MRI and intracranial MRA. No acute
finding.

## 2018-03-06 ENCOUNTER — Other Ambulatory Visit: Payer: Self-pay | Admitting: Cardiovascular Disease

## 2018-03-06 ENCOUNTER — Ambulatory Visit
Admission: RE | Admit: 2018-03-06 | Discharge: 2018-03-06 | Disposition: A | Discharge status: Home / Self Care | Payer: Medicare Other | Source: Ambulatory Visit | Attending: Cardiovascular Disease | Admitting: Cardiovascular Disease

## 2018-03-06 DIAGNOSIS — J40 Bronchitis, not specified as acute or chronic: Secondary | ICD-10-CM

## 2019-04-23 IMAGING — CR DG CHEST 2V
2 series · 2 of 2 positions shown · non-contrast
Comparison: PA and lateral chest x-ray June 05, 2016

CLINICAL DATA: Shortness of breath. History of COPD, current
smoker, CHF, previous MI.

EXAM:
CHEST - 2 VIEW

[w chest pa]
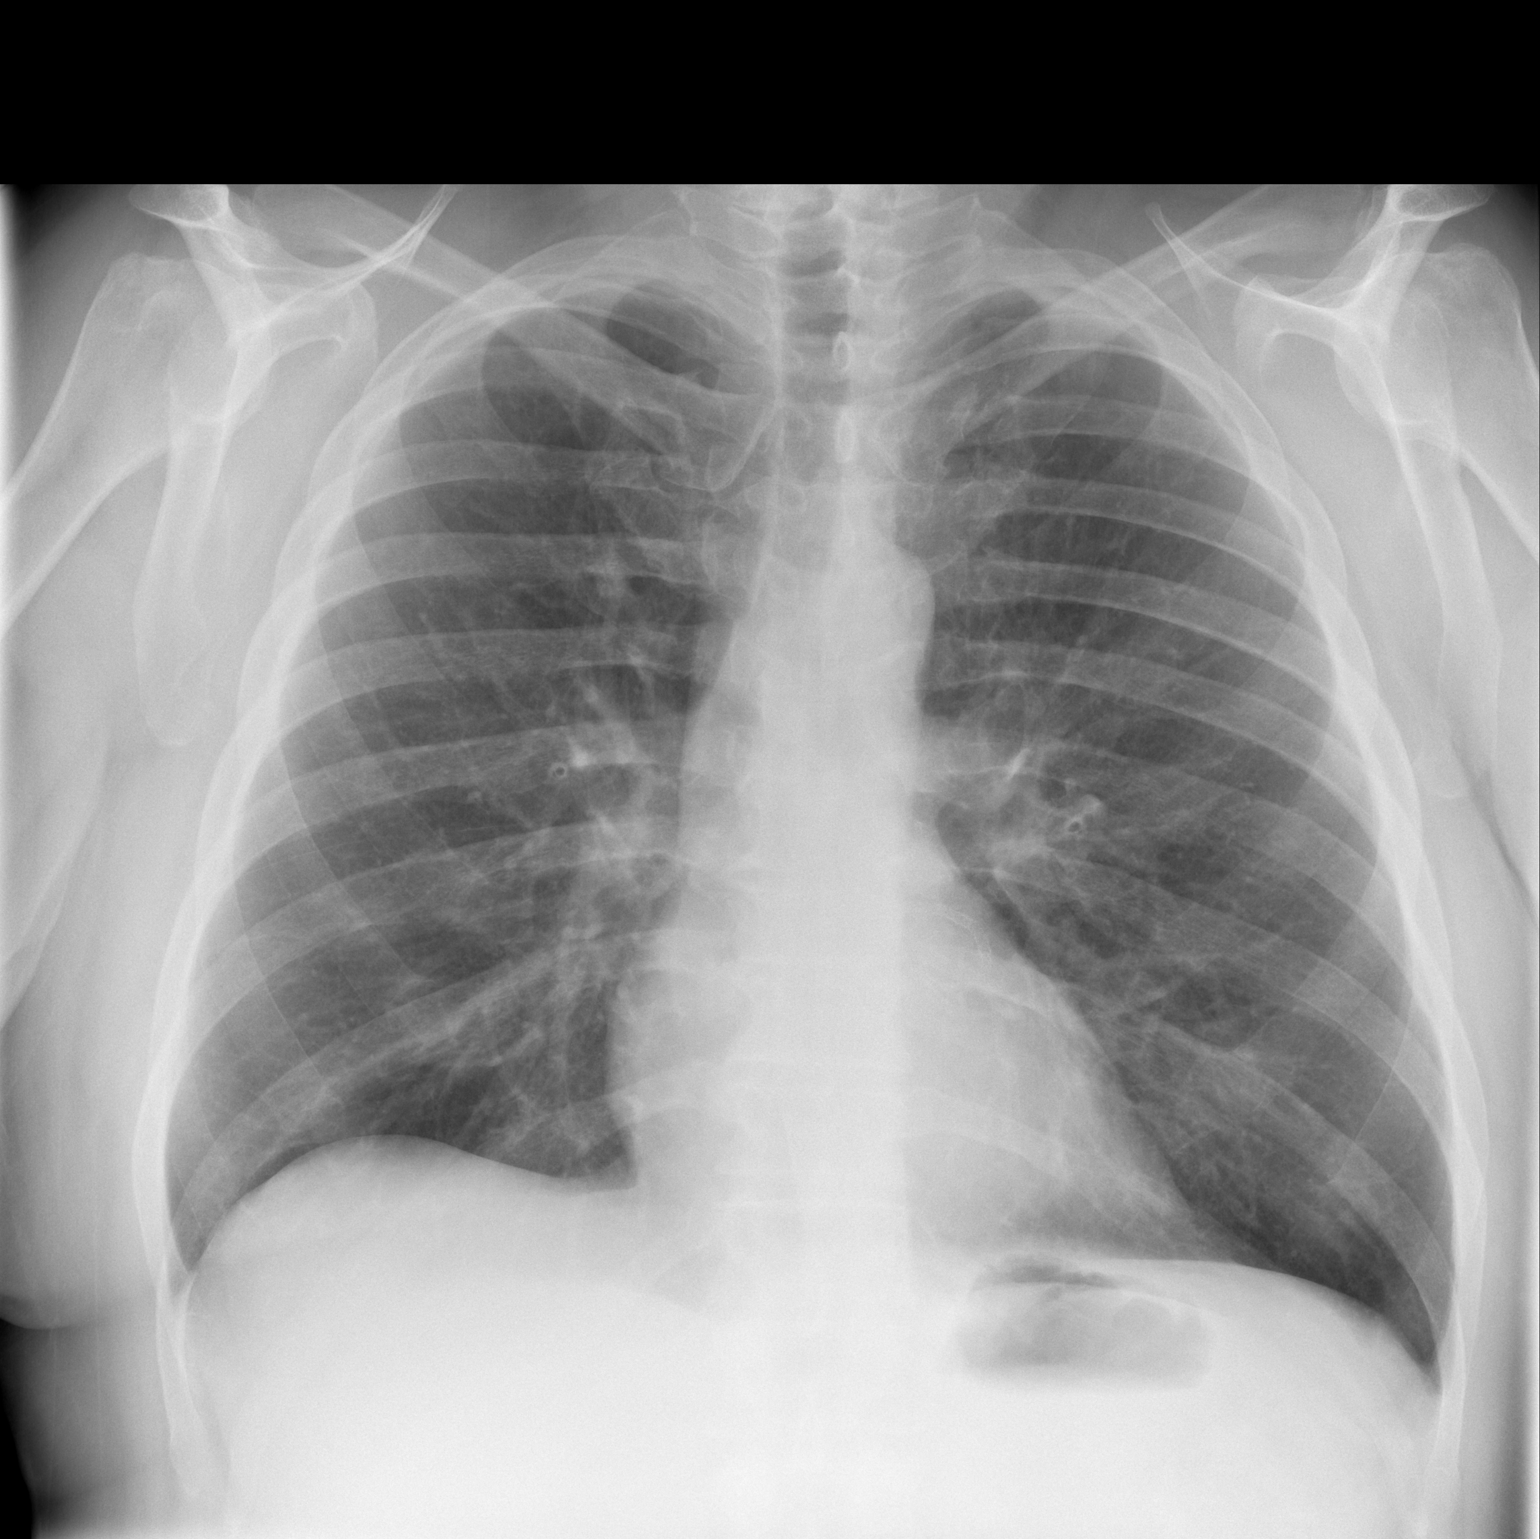

[w chest lat]
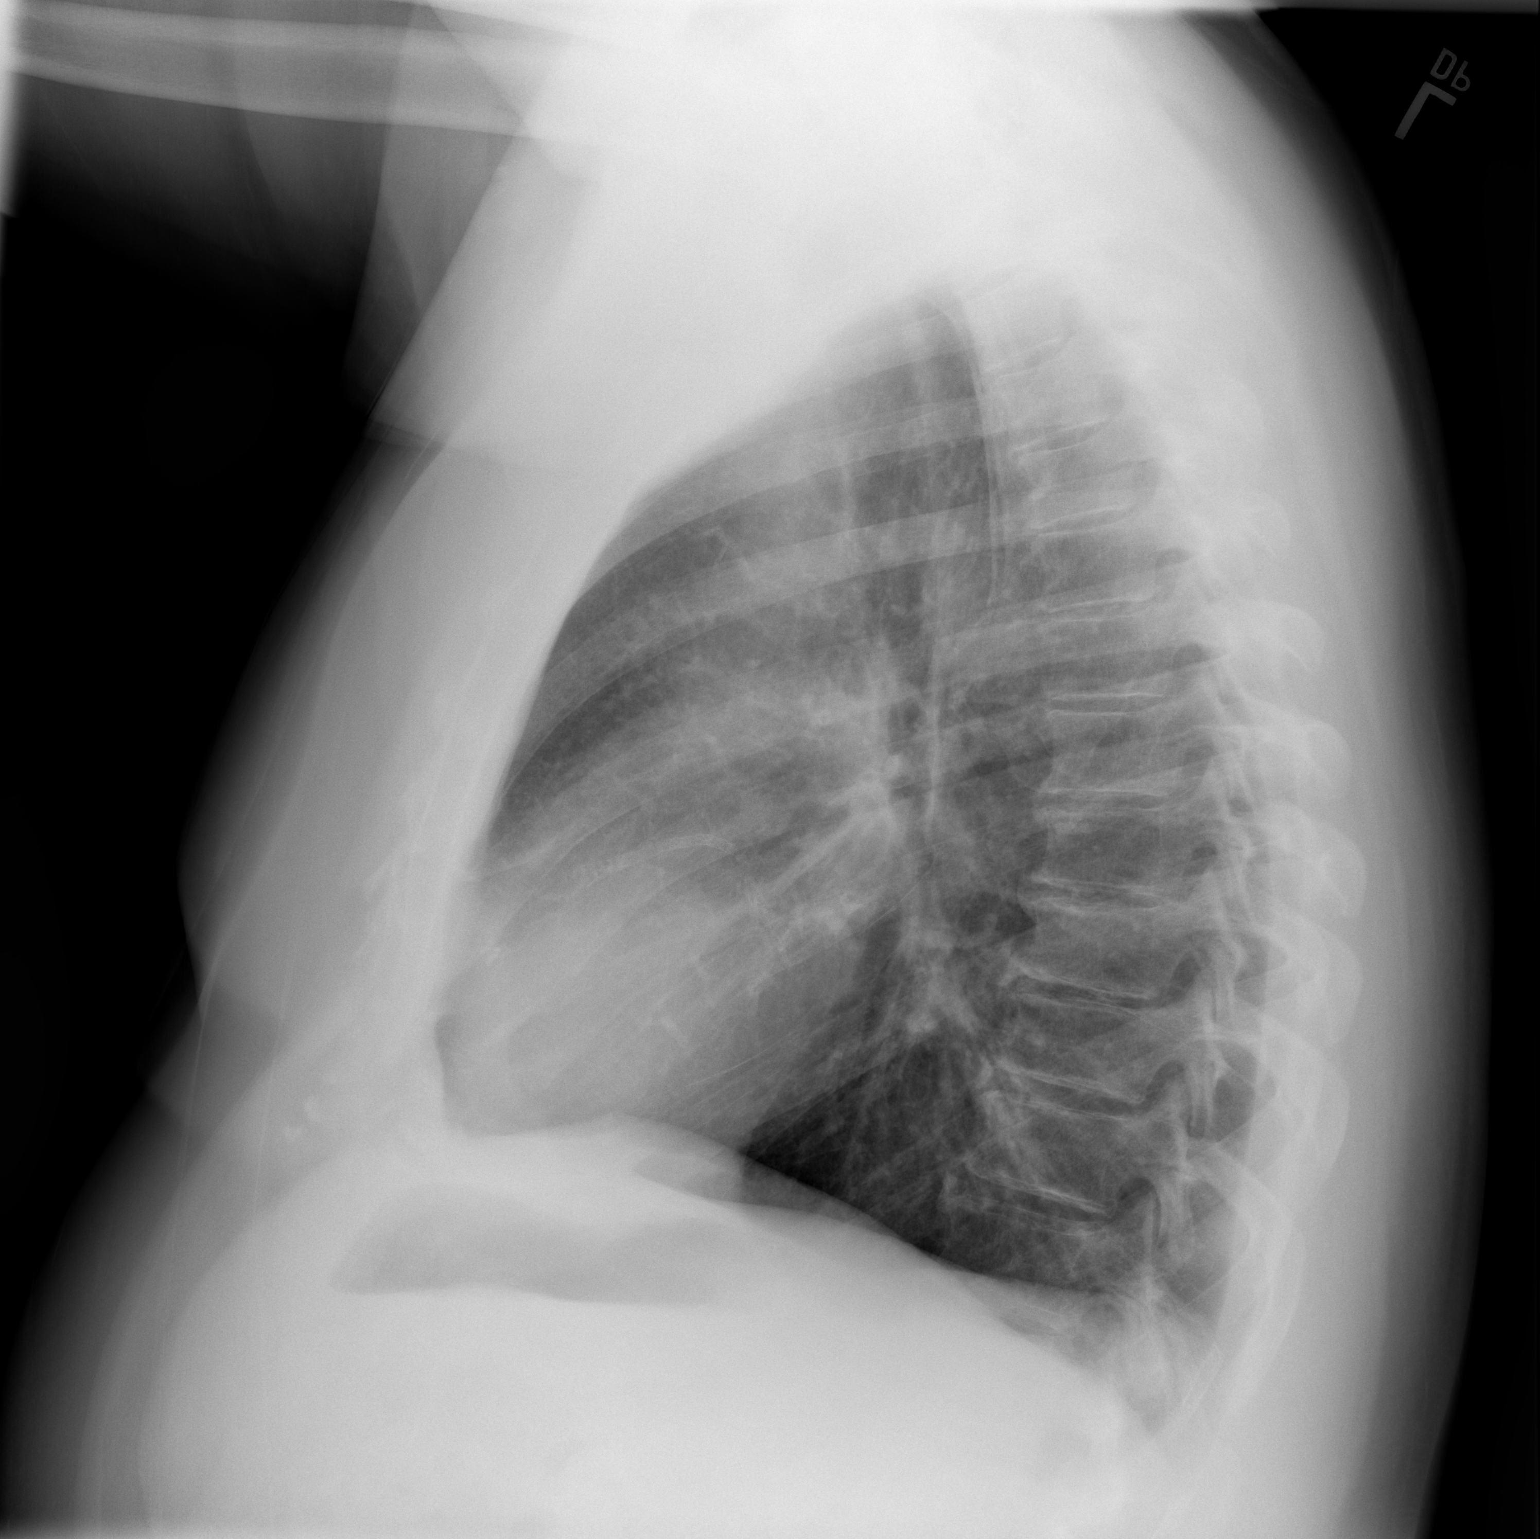

[2 of 2 positions shown; findings below may reference images not displayed]

FINDINGS: The lungs are well-expanded. There is no focal infiltrate. There is
no pleural effusion. The heart and pulmonary vascularity are normal.
The mediastinum is normal in width. The trachea is midline. The bony
thorax exhibits no acute abnormality. There is calcification in the
wall of the aortic arch.
IMPRESSION: There is no active cardiopulmonary disease. Probable mild underlying
chronic bronchitic-smoking related changes.

Thoracic aortic atherosclerosis.
